# Patient Record
Sex: Female | Born: 1975
Health system: Southern US, Community
[De-identification: ages and names within clinical notes are randomized; demographics above are authoritative.]

## PROBLEM LIST (undated history)

## (undated) DIAGNOSIS — E079 Disorder of thyroid, unspecified: Secondary | ICD-10-CM

## (undated) DIAGNOSIS — E785 Hyperlipidemia, unspecified: Secondary | ICD-10-CM

## (undated) DIAGNOSIS — F419 Anxiety disorder, unspecified: Secondary | ICD-10-CM

## (undated) HISTORY — DX: Anxiety disorder, unspecified: F41.9

## (undated) HISTORY — DX: Disorder of thyroid, unspecified: E07.9

## (undated) HISTORY — DX: Hyperlipidemia, unspecified: E78.5

## (undated) HISTORY — PX: TUBAL LIGATION: SHX77

---

## 2006-11-26 ENCOUNTER — Emergency Department (HOSPITAL_COMMUNITY): Admission: EM | Admit: 2006-11-26 | Discharge: 2006-11-26 | Payer: Self-pay | Admitting: Emergency Medicine

## 2007-06-28 ENCOUNTER — Ambulatory Visit (HOSPITAL_COMMUNITY): Admission: RE | Admit: 2007-06-28 | Discharge: 2007-06-28 | Payer: Self-pay | Admitting: Family Medicine

## 2007-08-02 ENCOUNTER — Ambulatory Visit (HOSPITAL_COMMUNITY): Admission: RE | Admit: 2007-08-02 | Discharge: 2007-08-02 | Payer: Self-pay | Admitting: Family Medicine

## 2008-01-03 ENCOUNTER — Ambulatory Visit: Payer: Self-pay | Admitting: Obstetrics and Gynecology

## 2008-01-05 ENCOUNTER — Inpatient Hospital Stay (HOSPITAL_COMMUNITY): Admission: AD | Admit: 2008-01-05 | Discharge: 2008-01-10 | Payer: Self-pay | Admitting: Obstetrics & Gynecology

## 2008-01-05 ENCOUNTER — Ambulatory Visit: Payer: Self-pay | Admitting: Obstetrics and Gynecology

## 2008-07-22 IMAGING — US US PELVIS COMPLETE MODIFY
1 series · 14 of 25 positions shown · non-contrast
Comparison: none

CLINICAL DATA: 30-year-old female with lower abdominal and pelvic pain. 
TRANSABDOMINAL AND TRANSVAGINAL PELVIC ULTRASOUND:
TECHNIQUE: Both transabdominal and transvaginal ultrasound examinations of the pelvis were performed including evaluation of the uterus, ovaries, adnexal regions, and pelvic cul-de-sac.

[Series 1: unknown · 0.32mm/px · 14 of 64 slices shown]
[im 1/64]
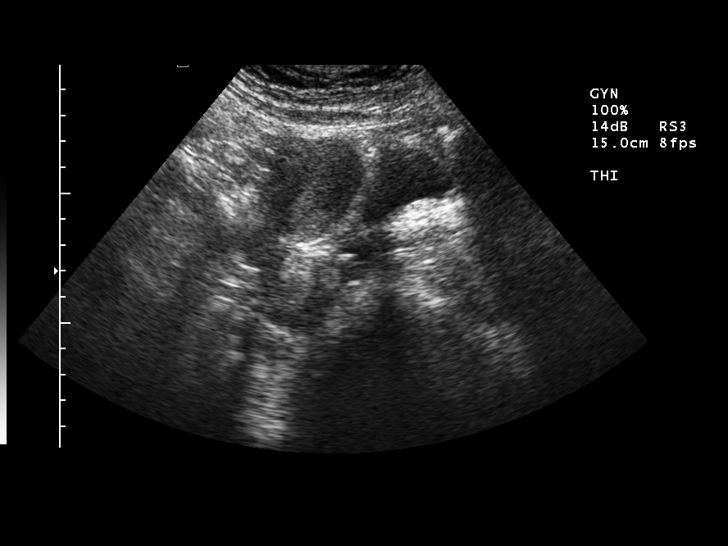
[im 6/64]
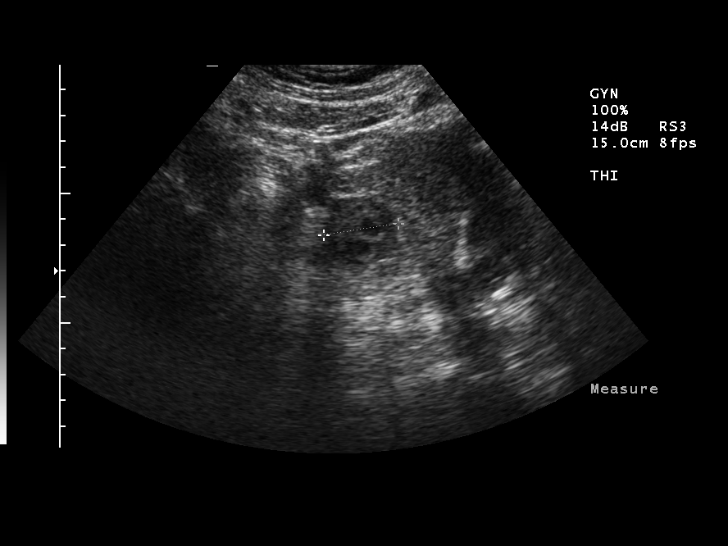
[im 11/64]
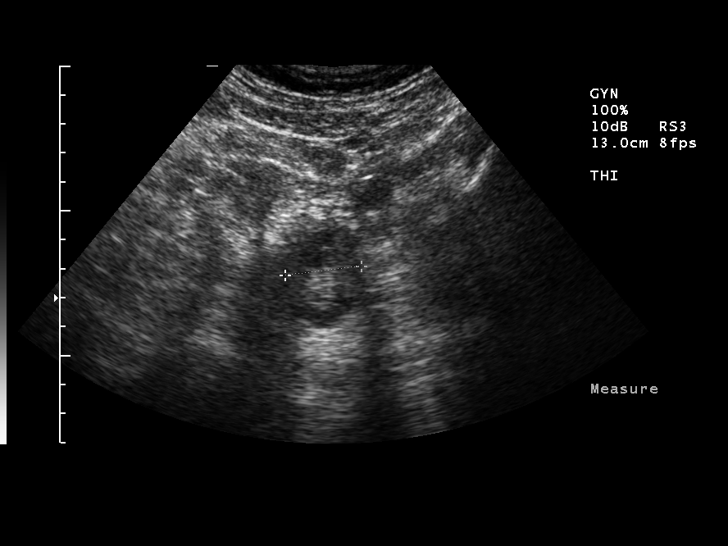
[im 16/64]
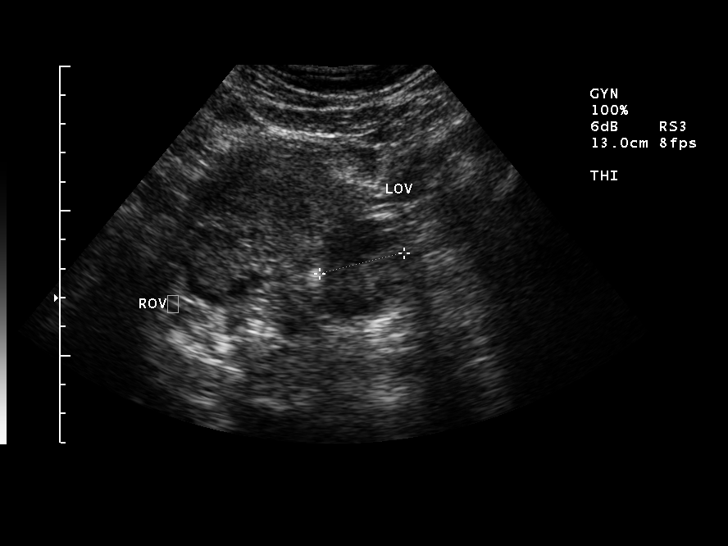
[im 22/64]
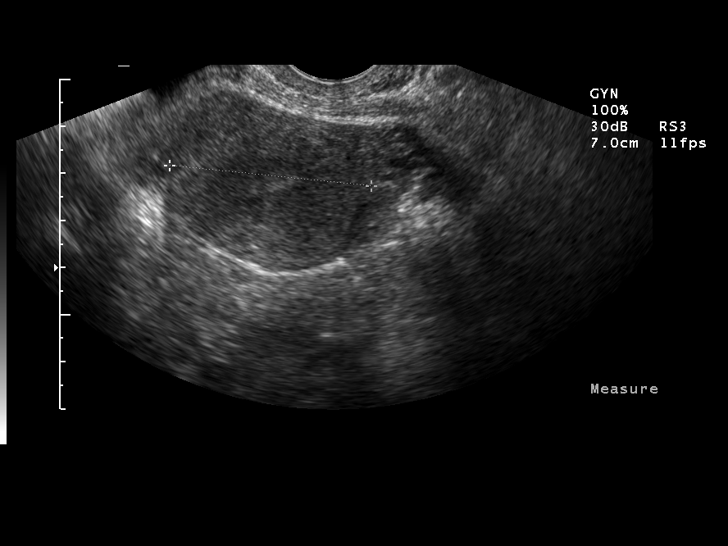
[im 24/64]
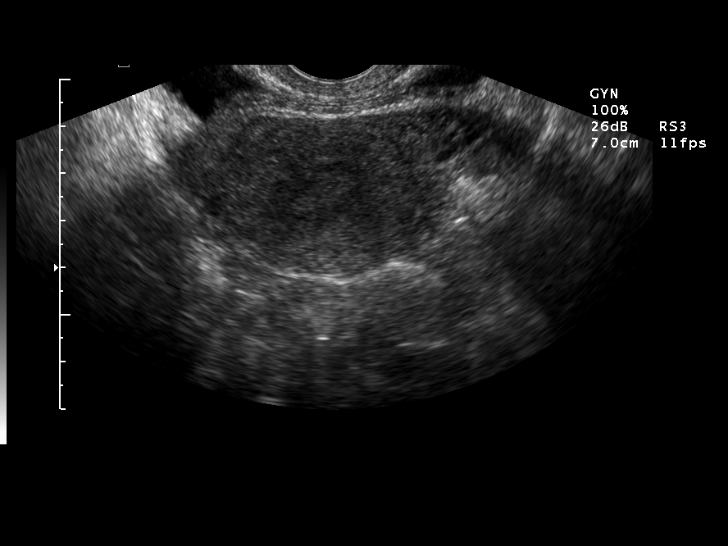
[im 29/64]
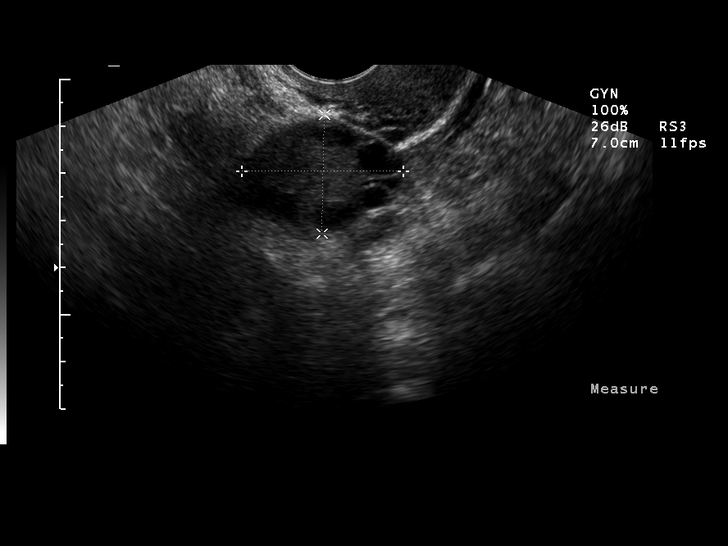
[im 35/64]
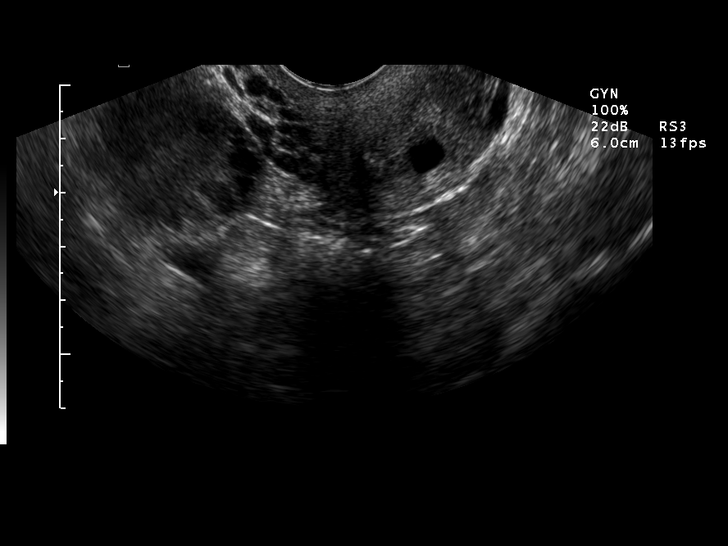
[im 40/64]
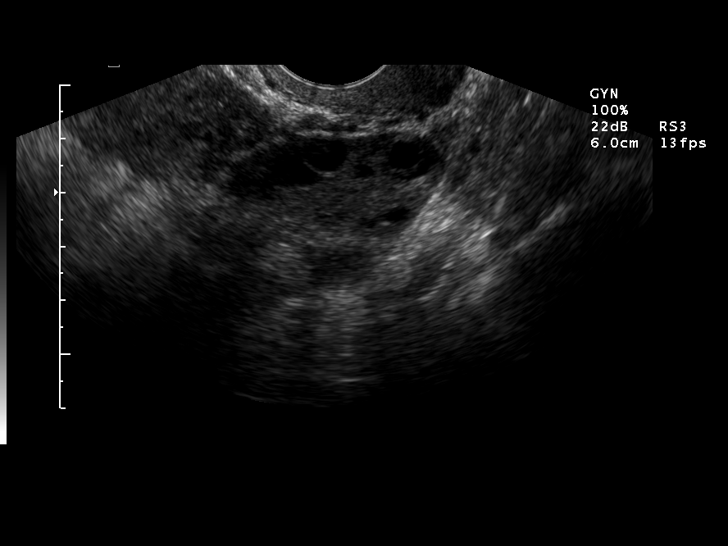
[im 43/64]
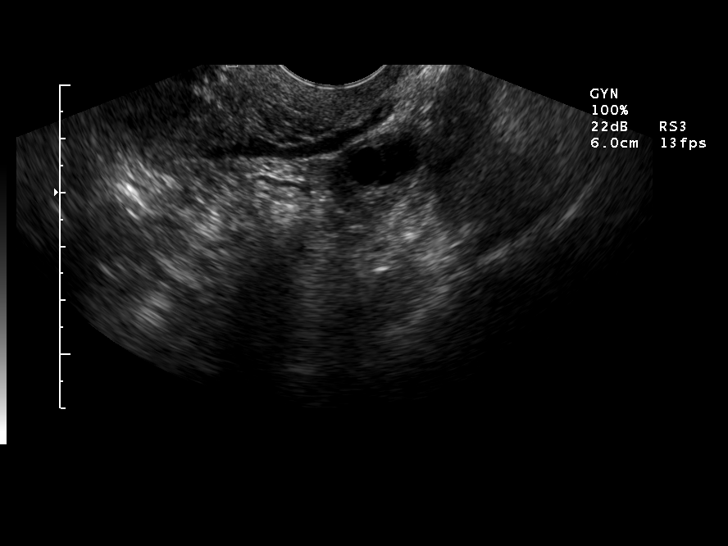
[im 48/64]
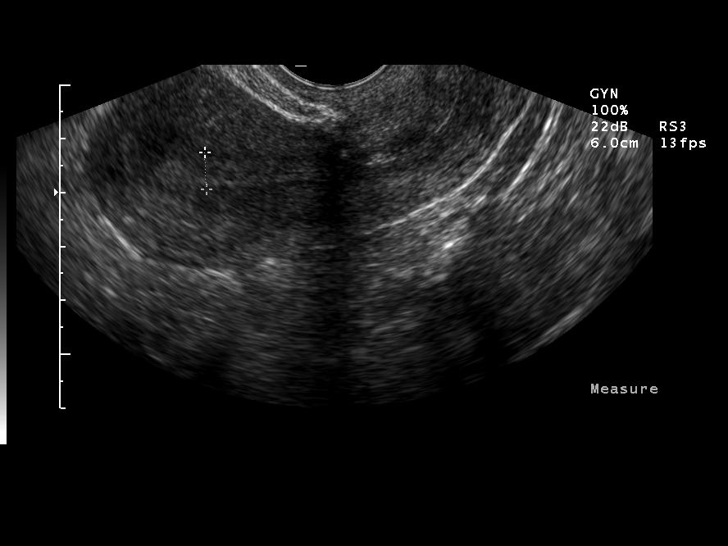
[im 53/64]
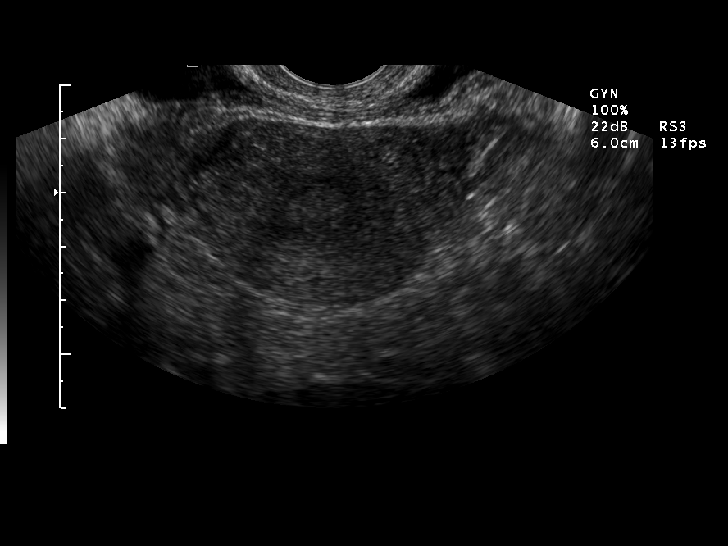
[im 58/64]
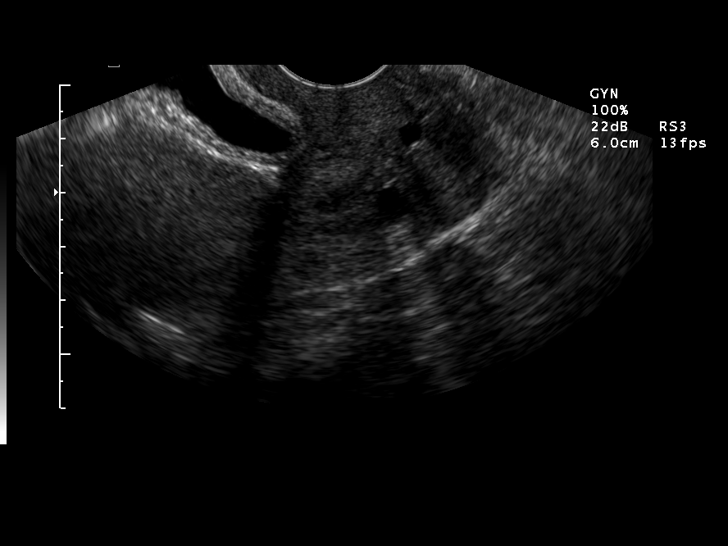
[im 64/64]
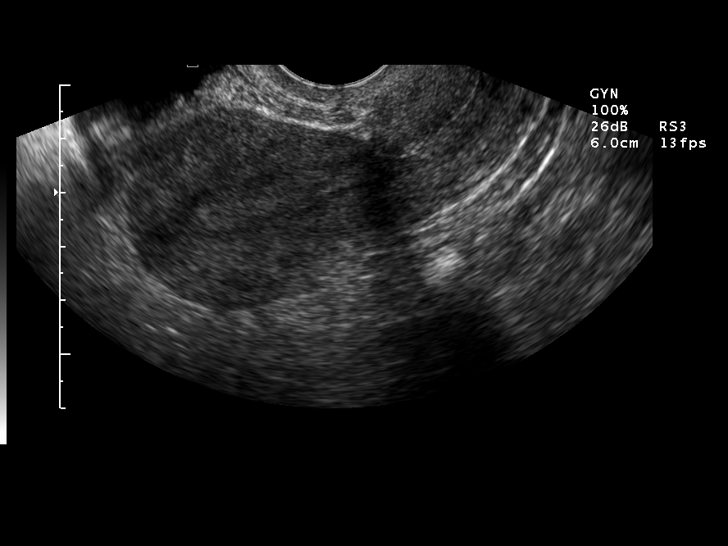

[14 of 25 positions shown; findings below may reference images not displayed]

FINDINGS: Uterus is normal measuring 8.5 x 3.8 x 4.3 cm. Endometrium is normal measuring 7 mm. Small cervical Nabothian cyst is noted. Right ovary measures 3.4 x 2.5 x 2.4 cm. Left ovary measures 3.8 x 2.4 x 2.4 cm. Ovaries demonstrate several follicles bilaterally.  No free fluid.
IMPRESSION: No acute finding by ultrasound.

## 2009-03-25 ENCOUNTER — Ambulatory Visit (HOSPITAL_COMMUNITY): Admission: RE | Admit: 2009-03-25 | Discharge: 2009-03-25 | Payer: Self-pay | Admitting: Family Medicine

## 2009-03-25 ENCOUNTER — Encounter: Payer: Self-pay | Admitting: Family Medicine

## 2009-07-29 ENCOUNTER — Inpatient Hospital Stay (HOSPITAL_COMMUNITY): Admission: RE | Admit: 2009-07-29 | Discharge: 2009-07-31 | Payer: Self-pay | Admitting: Obstetrics & Gynecology

## 2009-07-29 ENCOUNTER — Ambulatory Visit: Payer: Self-pay | Admitting: Obstetrics & Gynecology

## 2010-07-16 LAB — CBC
HCT: 31.4 % — ABNORMAL LOW (ref 36.0–46.0)
HCT: 37.3 % (ref 36.0–46.0)
Hemoglobin: 10.9 g/dL — ABNORMAL LOW (ref 12.0–15.0)
MCHC: 34.7 g/dL (ref 30.0–36.0)
MCHC: 34.8 g/dL (ref 30.0–36.0)
MCV: 93 fL (ref 78.0–100.0)
MCV: 95.1 fL (ref 78.0–100.0)
Platelets: 184 10*3/uL (ref 150–400)
RBC: 3.3 MIL/uL — ABNORMAL LOW (ref 3.87–5.11)
RDW: 15.1 % (ref 11.5–15.5)

## 2010-09-09 NOTE — Op Note (Signed)
NAME:  Stacie Pratt, Stacie Pratt        ACCOUNT NO.:  [id number]   MEDICAL RECORD NO.:  [id number]          PATIENT TYPE:  INP   LOCATION:  9134                          [facility]ILITY:  [facility]   PHYSICIAN:  Tilda Burrow, M.D. DATE OF BIRTH:  [birth year]   DATE OF PROCEDURE:  01/06/2008  DATE OF DISCHARGE:                               OPERATIVE REPORT   PREOPERATIVE DIAGNOSES:  Pregnancy 41 weeks 2 days, prior cesarean  section, desiring vaginal birth after cesarean, induction of labor with  developed nonreassuring fetal status.   POSTOPERATIVE DIAGNOSES:  Pregnancy 41 weeks 2 days, prior cesarean  section, desiring vaginal birth after cesarean, induction of labor with  developed nonreassuring fetal status.  Also, uterine window and old C-  section scar, body cord x1.   PROCEDURE:  Emergency repeat low transverse cervical cesarean section.   SURGEON:  Tilda Burrow, MD   ASSISTANT:  None.   ANESTHESIA:  Epidural.   FINDINGS:  Uterine window and old transverse scar approximately 5 cm in  length, female infant, Apgars 9 and 9, blood gas pH 7.19, body cord x1.  Inferior extension of C-section scar on right.   SPECIMENS:  Placenta to pathology.   ESTIMATED BLOOD LOSS:  500 mL.   INDICATIONS:  A 35 year old Hispanic female undergoing a trial of labor  after cesarean section with previous Foley bulb, cervical ripening, and  Pitocin induction of labor.  Written notes document counseling regarding  trial of labor during earlier portions of admission.  I was called to  see the patient at approximately 2015 whereupon significant prolonged  decel was associated with membrane rupture revealing clear fluid.  Scalp  electrode confirmed baseline in the 100 range with slight improvement  until a contraction developed at which time decel dropped into the 80s.  Decision for cesarean section performed, terbutaline administered.  The  patient taken promptly to C-section room for stat C-section.   DETAILS OF PROCEDURE:  Upon arrival to the OR, epidural was started.  Fetal heart rate confirmed as having improved in response to transfer to  Trendelenburg position and terbutaline and noted in the 140s.  Epidural  quickly topped up and time-out completed.  Lower abdominal vertical  incision repeated with excision of an old thick keloid.  The fascia was  slit in the midline and rectus muscles split in the midline.  The  peritoneal cavity was entered and then the fascial opening taken down to  near the symphysis pubis.  Peritoneal cavity was opened and the bladder  flap was quite high.  The bladder was quite edematous, but no blood  encountered.  Bladder flap was taken down on the lower uterine segment  after retractor pulled the bladder inferiorly.  Once the bladder flap  was taken down, the lower uterine segment was palpated.  The index  finger identified a 5-cm wide window in the midline portion of the very  thinned out old C-section scar.  This opened transversely with minimal  traction transversely and the umbilical cord billowed through the  incision immediately.  Fetal vertex was in the right occiput posterior  position, was rotated, and then an  incision and delivered with fundal  pressure.  The cord was clamped and the infant passed to Dr. Francine Graven,  pediatrician, see her notes for baby's status.  Blood gas pH returned  7.19 with Apgars 9 and 9 assigned.  Placenta was delivered intact with  membranes removed intact.  The uterus was inspected.  No membrane  remnants were identified.  The uterus was inspected and there was an  asymmetric tearing of the uterine incision.  The incision extended into  the uterine vessels on the left side with some arterial bleeding on the  left and small amount, easily controlled with ring forceps and the right  side showed an inferior extension down from the right side of the  incision beneath the bladder.  The inferior margins of the incision   extension could be identified.  The uterus was swabbed out, confirmed as  dry and hemostatic once the incision edges were controlled.  The left  uterine vessel was ligated carefully staying above the bladder and  hemostasis dramatically improved.  Attention was then directed to the  inferior extension on the right side which was sewed from its inferior  most portion upward in a single layer running locking closure.  The  incision itself was then closed with a single layer running locking  closure with a second imbricating closure layer placed on the left one-  half of the incision.  Hemostasis was satisfactory at this time.  One  single mattress suture was required in the midline where the bladder  flap had been taken down to complete hemostasis.   The bladder flap did not require reapproximation.  The bladder was noted  to be edematous.  Urine was checked and was of normal color without  bleeding.  There was no suspicion at anytime that the bladder was  injured in any way.  The peritoneal cavity was inspected and some clots  removed from the left side and the rest of the abdomen appeared clean.  First lap count was correct.  The anterior peritoneum was closed with  running 2-0 chromic on the peritoneum and the fascia closed with running  0 Vicryl.  The subcu fatty tissues were trimmed enough to allow for good  tissue edge approximation and a two-layer closure of interrupted  vertical mattress sutures of 2-0 plain placed in the deep area and a  running horizontal mattress suture placed just beneath the skin and  staple closure of the skin completed the procedure.  Sponge and needle  counts were correct.  EBL 500 mL, less than normal for my C-sections,  and the patient went to recovery room in stable condition.      Tilda Burrow, M.D.  Electronically Signed     JVF/MEDQ  D:  01/06/2008  T:  01/07/2008  Job:  [id number]   cc:   [facility]

## 2010-09-09 NOTE — Discharge Summary (Signed)
Stacie Pratt, Stacie Pratt        ACCOUNT NO.:  [id number]   MEDICAL RECORD NO.:  [id number]          PATIENT TYPE:  INP   LOCATION:  9134                          [facility]ILITY:  [facility]   PHYSICIAN:  Tilda Burrow, M.D. DATE OF BIRTH:  [birth year]   DATE OF ADMISSION:  01/05/2008  DATE OF DISCHARGE:  01/09/2008                               DISCHARGE SUMMARY   ADMITTING DIAGNOSES:  Pregnancy at 61 weeks' gestation, prior cesarean  section desiring trial of labor after cesarean.   DISCHARGE DIAGNOSES:  Pregnancy at 41+ weeks' gestation, delivered  failed trial of labor after cesarean, the uterine window and old C-  section scar and nonreassuring fetal status indicating need for cesarean  delivery.   FINDINGS:  Female infant, Apgars 9 and 9, pH 7.19 and body cord x1.   HOSPITAL SUMMARY:  This 35 year old female gravida 2, para 1 at 41+  weeks' gestation was admitted for induction of labor after the patient  requesting a trial of labor after cesarean.  Prior cesarean section was  in [place], a vertical abdominal incision was used.  The patient reports  signing of trial of labor consent forms as an outpatient basis.  She was  sent to the hospital after her nonstress test at 41 weeks and 1 day  showed a fetal heart rate deceleration in the 80s for 2 minutes with  spontaneous complete recovery.  She was admitted at that time with Foley  bulb cervical ripening attempted.   HOSPITAL COURSE:  The patient was admitted, a Foley bulb ripening  attempted on the afternoon of January 05, 2008.  The following day,  January 06, 2008, she was begun on Pitocin.  She progressed only  slowly through the day.  She was begun on 2 milliunits at 2 a.m.,  progressed through the day with minimal progress, probably was in place  until midday.  At 6 p.m., cervix was 3 cm long, -3 with vertex not  really entered the cervix, spontaneous rupture of membranes occurred  approximately 8:15 p.m. and when the cervix  was checked it was 6-7 cm,  90%, -3 with vertex remaining at pelvic end, ischial spines were  considered extremely prominent.  Due to the nonreassuring fetal status  and only gradual improvement with the minimal prognosis for vaginal  delivery given the type.  However, she was taken to the OR for a C-  section in an emergency fashion.  Findings at the time of surgery  include a female infant, Apgars 9 and 9, pH of 7.19, and there was  a  body cord x1.  She had a minimal blood loss of 500 mL.  The surgery was  notable and that once the edematous bladder was taken down on the low  uterine segment.  There was a window in the low uterine segment scar  that did not tear into the adjacent tissues.  C-section was otherwise  uneventful.  Postoperatively, the patient remained stable, had slow  ambulation for the first 24 hours, but then did well.  The baby  breastfed.  She had a postpartum hemoglobin 10, hematocrit 31.6 compared  to hemoglobin 12.9 and  hematocrit 38 at the time of admission.  She  remained afebrile throughout the postpartum course.  The incision was in  good shape with no erythema.  Staples were left in place and she will  have visiting home health nurse see her in 5-7 days for staple removal.      Tilda Burrow, M.D.  Electronically Signed    JVF/MEDQ  D:  01/09/2008  T:  01/10/2008  Job:  [id number]

## 2010-10-31 ENCOUNTER — Other Ambulatory Visit (HOSPITAL_COMMUNITY)
Admission: RE | Admit: 2010-10-31 | Discharge: 2010-10-31 | Disposition: A | Discharge status: Home / Self Care | Payer: Self-pay | Source: Ambulatory Visit | Attending: Family Medicine | Admitting: Family Medicine

## 2010-10-31 ENCOUNTER — Other Ambulatory Visit: Payer: Self-pay | Admitting: Family Medicine

## 2010-10-31 DIAGNOSIS — Z113 Encounter for screening for infections with a predominantly sexual mode of transmission: Secondary | ICD-10-CM | POA: Insufficient documentation

## 2010-10-31 DIAGNOSIS — Z124 Encounter for screening for malignant neoplasm of cervix: Secondary | ICD-10-CM | POA: Insufficient documentation

## 2010-11-05 ENCOUNTER — Other Ambulatory Visit: Payer: Self-pay | Admitting: Family Medicine

## 2011-01-28 LAB — CBC
HCT: 38
Hemoglobin: 10.8 — ABNORMAL LOW
MCHC: 34.1
MCV: 93.4
Platelets: 176
Platelets: 185
RBC: 4.08
RDW: 14.7
RDW: 14.9
RDW: 15

## 2011-01-28 LAB — RPR: RPR Ser Ql: NONREACTIVE

## 2011-02-09 LAB — DIFFERENTIAL
Basophils Relative: 0
Eosinophils Absolute: 0.1
Monocytes Absolute: 0.3
Monocytes Relative: 5

## 2011-02-09 LAB — WET PREP, GENITAL
Clue Cells Wet Prep HPF POC: NONE SEEN
Trich, Wet Prep: NONE SEEN
WBC, Wet Prep HPF POC: NONE SEEN
Yeast Wet Prep HPF POC: NONE SEEN

## 2011-02-09 LAB — COMPREHENSIVE METABOLIC PANEL
ALT: 22
Albumin: 4.3
Alkaline Phosphatase: 37 — ABNORMAL LOW
GFR calc Af Amer: >60
Potassium: 3.8
Sodium: 138
Total Protein: 7.7

## 2011-02-09 LAB — URINALYSIS, ROUTINE W REFLEX MICROSCOPIC
Glucose, UA: NEGATIVE
Protein, ur: NEGATIVE
Urobilinogen, UA: 0.2

## 2011-02-09 LAB — URINE MICROSCOPIC-ADD ON

## 2011-02-09 LAB — GC/CHLAMYDIA PROBE AMP, GENITAL
Chlamydia, DNA Probe: NEGATIVE
GC Probe Amp, Genital: NEGATIVE

## 2011-02-09 LAB — CBC
Hemoglobin: 12.9
Platelets: 207
RDW: 13.3

## 2011-02-09 LAB — HCG, QUANTITATIVE, PREGNANCY: hCG, Beta Chain, Quant, S: <2

## 2011-12-09 ENCOUNTER — Other Ambulatory Visit (HOSPITAL_COMMUNITY)
Admission: RE | Admit: 2011-12-09 | Discharge: 2011-12-09 | Disposition: A | Discharge status: Home / Self Care | Payer: Self-pay | Source: Ambulatory Visit | Attending: Family Medicine | Admitting: Family Medicine

## 2011-12-09 ENCOUNTER — Other Ambulatory Visit: Payer: Self-pay | Admitting: Family Medicine

## 2011-12-09 DIAGNOSIS — Z113 Encounter for screening for infections with a predominantly sexual mode of transmission: Secondary | ICD-10-CM | POA: Insufficient documentation

## 2011-12-09 DIAGNOSIS — Z124 Encounter for screening for malignant neoplasm of cervix: Secondary | ICD-10-CM | POA: Insufficient documentation

## 2011-12-15 ENCOUNTER — Other Ambulatory Visit (HOSPITAL_COMMUNITY): Payer: Self-pay | Admitting: Family Medicine

## 2011-12-15 DIAGNOSIS — N949 Unspecified condition associated with female genital organs and menstrual cycle: Secondary | ICD-10-CM

## 2011-12-23 ENCOUNTER — Other Ambulatory Visit (HOSPITAL_COMMUNITY): Payer: Self-pay

## 2011-12-23 ENCOUNTER — Inpatient Hospital Stay (HOSPITAL_COMMUNITY): Admission: RE | Admit: 2011-12-23 | Payer: Self-pay | Source: Ambulatory Visit

## 2016-05-12 ENCOUNTER — Other Ambulatory Visit: Payer: Self-pay | Admitting: Primary Care

## 2016-05-12 DIAGNOSIS — Z1231 Encounter for screening mammogram for malignant neoplasm of breast: Secondary | ICD-10-CM

## 2016-05-21 ENCOUNTER — Ambulatory Visit
Admission: RE | Admit: 2016-05-21 | Discharge: 2016-05-21 | Disposition: A | Discharge status: Home / Self Care | Payer: No Typology Code available for payment source | Source: Ambulatory Visit | Attending: Primary Care | Admitting: Primary Care

## 2016-05-21 DIAGNOSIS — Z1231 Encounter for screening mammogram for malignant neoplasm of breast: Secondary | ICD-10-CM

## 2017-07-20 ENCOUNTER — Other Ambulatory Visit: Payer: Self-pay | Admitting: Primary Care

## 2017-07-20 DIAGNOSIS — Z1231 Encounter for screening mammogram for malignant neoplasm of breast: Secondary | ICD-10-CM

## 2017-08-20 ENCOUNTER — Ambulatory Visit
Admission: RE | Admit: 2017-08-20 | Discharge: 2017-08-20 | Disposition: A | Discharge status: Home / Self Care | Payer: No Typology Code available for payment source | Source: Ambulatory Visit | Attending: Primary Care | Admitting: Primary Care

## 2017-08-20 DIAGNOSIS — Z1231 Encounter for screening mammogram for malignant neoplasm of breast: Secondary | ICD-10-CM

## 2018-10-06 ENCOUNTER — Encounter (HOSPITAL_COMMUNITY): Payer: Self-pay | Admitting: Emergency Medicine

## 2018-10-06 ENCOUNTER — Other Ambulatory Visit: Payer: Self-pay

## 2018-10-06 ENCOUNTER — Ambulatory Visit (HOSPITAL_COMMUNITY)
Admission: EM | Admit: 2018-10-06 | Discharge: 2018-10-06 | Disposition: A | Discharge status: Home / Self Care | Payer: Self-pay | Attending: Family Medicine | Admitting: Family Medicine

## 2018-10-06 DIAGNOSIS — B351 Tinea unguium: Secondary | ICD-10-CM

## 2018-10-06 DIAGNOSIS — F411 Generalized anxiety disorder: Secondary | ICD-10-CM

## 2018-10-06 MED ORDER — HYDROXYZINE HCL 25 MG PO TABS: 25.0000 mg | ORAL_TABLET | Freq: Four times a day (QID) | ORAL | 0 refills | Status: DC

## 2018-10-06 MED ORDER — TERBINAFINE HCL 250 MG PO TABS: 250.0000 mg | ORAL_TABLET | Freq: Every day | ORAL | 0 refills | Status: DC

## 2018-10-06 NOTE — ED Triage Notes (Addendum)
Right thumb nail is dark and nail is built up.  Patient thinks water may have seeped into thumb nail while doing dishes.  Patient has had issues with this thumb nail for a month, pcp was sending her to a specialist, but this has been halted by covid  Requesting hard copy script

## 2018-10-11 NOTE — ED Provider Notes (Signed)
[place]   [id number] 10/06/18 Arrival Time: 1916  ASSESSMENT & PLAN:  1. Onychomycosis   2. Generalized anxiety disorder     Meds ordered this encounter  Medications  . terbinafine (LAMISIL) 250 MG tablet    Sig: Take 1 tablet (250 mg total) by mouth daily.    Dispense:  42 tablet    Refill:  0  . hydrOXYzine (ATARAX/VISTARIL) 25 MG tablet    Sig: Take 1 tablet (25 mg total) by mouth every 6 (six) hours.    Dispense:  12 tablet    Refill:  0   Will follow up with PCP or here if worsening or failing to improve as anticipated. Recommend PCP evaluation for anxiety.  Reviewed expectations re: course of current medical issues. Questions answered. Outlined signs and symptoms indicating need for more acute intervention. Patient verbalized understanding. After Visit Summary given.   SUBJECTIVE:  Wanza Szumski is a 43 y.o. female who reports many months of R thumbnail thickening and discoloration. Onset: gradual No associated pain or skin irritation. Progression: stable  Drainage? No  Known trigger? No  Contacts with similar? No Recent travel? No  Other associated symptoms: none Therapies tried thus far: none Arthralgia or myalgia? None Afebrile. Normal thumb ROM reported. No specific aggravating or alleviating factors reported. Also reports on/off anxiety; "feeling nervous"; no SI/HI. Occasionally affects sleep. PCP was supposed to send her to specialist for thumbnail but COVID has delayed.  ROS: As per HPI.  OBJECTIVE: Vitals:   10/06/18 1944  BP: 101/64  Pulse: 72  Resp: 18  Temp: 98.3 F (36.8 C)  TempSrc: Oral  SpO2: 100%    General appearance: alert; no distress Lungs: clear to auscultation bilaterally Heart: regular rate and rhythm Extremities: no edema Skin: warm and dry; onychomycosis of R thumbnail Psychological: alert and cooperative; normal mood and affect  No Known Allergies  History reviewed. No pertinent past medical  history. Social History   Socioeconomic History  . Marital status: Single    Spouse name: Not on file  . Number of children: Not on file  . Years of education: Not on file  . Highest education level: Not on file  Occupational History  . Not on file  Social Needs  . Financial resource strain: Not on file  . Food insecurity    Worry: Not on file    Inability: Not on file  . Transportation needs    Medical: Not on file    Non-medical: Not on file  Tobacco Use  . Smoking status: Never Smoker  Substance and Sexual Activity  . Alcohol use: Never    Frequency: Never  . Drug use: Never  . Sexual activity: Not on file  Lifestyle  . Physical activity    Days per week: Not on file    Minutes per session: Not on file  . Stress: Not on file  Relationships  . Social Herbalist on phone: Not on file    Gets together: Not on file    Attends religious service: Not on file    Active member of club or organization: Not on file    Attends meetings of clubs or organizations: Not on file    Relationship status: Not on file  . Intimate partner violence    Fear of current or ex partner: Not on file    Emotionally abused: Not on file    Physically abused: Not on file    Forced sexual activity: Not  on file  Other Topics Concern  . Not on file  Social History Narrative  . Not on file   Family History  Problem Relation Age of Onset  . Healthy Mother   . Healthy Father    History reviewed. No pertinent surgical history.   Mardella LaymanHagler, Ellamae Lybeck, MD 10/11/18 1124

## 2019-08-23 ENCOUNTER — Other Ambulatory Visit: Payer: Self-pay | Admitting: Nurse Practitioner

## 2019-08-23 ENCOUNTER — Encounter: Payer: Self-pay | Admitting: Nurse Practitioner

## 2019-08-23 ENCOUNTER — Ambulatory Visit: Payer: Self-pay | Attending: Nurse Practitioner | Admitting: Nurse Practitioner

## 2019-08-23 ENCOUNTER — Other Ambulatory Visit: Payer: Self-pay

## 2019-08-23 VITALS — Ht 59.84 in

## 2019-08-23 DIAGNOSIS — Z13220 Encounter for screening for lipoid disorders: Secondary | ICD-10-CM

## 2019-08-23 DIAGNOSIS — Z13228 Encounter for screening for other metabolic disorders: Secondary | ICD-10-CM

## 2019-08-23 DIAGNOSIS — B351 Tinea unguium: Secondary | ICD-10-CM

## 2019-08-23 DIAGNOSIS — Z130 Encounter for screening for diseases of the blood and blood-forming organs and certain disorders involving the immune mechanism: Secondary | ICD-10-CM

## 2019-08-23 MED ORDER — CICLOPIROX 8 % EX SOLN: Freq: Every day | CUTANEOUS | 3 refills | Status: DC

## 2019-08-23 MED FILL — CICLOPIROX 8% SOLUTION: 8 | 30 days supply | Qty: 7 | Fill #0

## 2019-08-23 NOTE — Progress Notes (Signed)
Virtual Visit via Telephone Note Due to national recommendations of social distancing due to [place] 19, telehealth visit is felt to be most appropriate for this patient at this time.  I discussed the limitations, risks, security and privacy concerns of performing an evaluation and management service by telephone and the availability of in person appointments. I also discussed with the patient that there may be a patient responsible charge related to this service. The patient expressed understanding and agreed to proceed.    I connected with Stacie Pratt on 08/23/19  at   9:50 AM EDT  EDT by telephone and verified that I am speaking with the correct person using two identifiers.   Consent I discussed the limitations, risks, security and privacy concerns of performing an evaluation and management service by telephone and the availability of in person appointments. I also discussed with the patient that there may be a patient responsible charge related to this service. The patient expressed understanding and agreed to proceed.   Location of Patient: Private Residence   Location of Provider: [place] participating in Telemedicine visit: Geryl Rankins FNP-BC [place] Interpreter ID# Dedra Skeens [id number]   History of Present Illness: Telemedicine visit for: Establish Care She has no previous past medical history.   REFERRED TO [facility] for mammogram today  Skin Problem She has concerns of nail fungus of the right thumbnail. She was prescribed lamisil in the Emergency Room last year which she states did not provide resolution of her skin problem. She took the lamisil for 1.5 months. Will prescribe penlac. May need to see dermatology if ineffective. Patient has been advised to apply for financial assistance and schedule to see our financial counselor.     History reviewed. No pertinent past medical history.  Past  Surgical History:  Procedure Laterality Date  . CESAREAN SECTION     3x  . TUBAL LIGATION      Family History  Problem Relation Age of Onset  . Healthy Mother   . Healthy Father   . Diabetes Sister     Social History   Socioeconomic History  . Marital status: Single    Spouse name: Not on file  . Number of children: Not on file  . Years of education: Not on file  . Highest education level: Not on file  Occupational History  . Not on file  Tobacco Use  . Smoking status: Never Smoker  . Smokeless tobacco: Never Used  Substance and Sexual Activity  . Alcohol use: Never  . Drug use: Never  . Sexual activity: Yes    Birth control/protection: Surgical  Other Topics Concern  . Not on file  Social History Narrative  . Not on file   Social Determinants of Health   Financial Resource Strain:   . Difficulty of Paying Living Expenses:   Food Insecurity:   . Worried About Charity fundraiser in the Last Year:   . Arboriculturist in the Last Year:   Transportation Needs:   . Film/video editor (Medical):   Marland Kitchen Lack of Transportation (Non-Medical):   Physical Activity:   . Days of Exercise per Week:   . Minutes of Exercise per Session:   Stress:   . Feeling of Stress :   Social Connections:   . Frequency of Communication with Friends and Family:   . Frequency of Social Gatherings with Friends and Family:   . Attends  Religious Services:   . Active Member of Clubs or Organizations:   . Attends Archivist Meetings:   Marland Kitchen Marital Status:      Observations/Objective: Awake, alert and oriented x 3   Review of Systems  Constitutional: Negative for fever, malaise/fatigue and weight loss.  HENT: Negative.  Negative for nosebleeds.   Eyes: Negative.  Negative for blurred vision, double vision and photophobia.  Respiratory: Negative.  Negative for cough and shortness of breath.   Cardiovascular: Negative.  Negative for chest pain, palpitations and leg swelling.   Gastrointestinal: Negative.  Negative for heartburn, nausea and vomiting.  Musculoskeletal: Negative.  Negative for myalgias.  Skin: Positive for rash.  Neurological: Negative.  Negative for dizziness, focal weakness, seizures and headaches.  Psychiatric/Behavioral: Negative.  Negative for suicidal ideas.    Assessment and Plan: Stacie Pratt was seen today for establish care.  Diagnoses and all orders for this visit:  Onychomycosis of nail of digit of hand -     Ambulatory referral to Dermatology -     Hemoglobin A1c; Future  Screening for metabolic disorder -     [id number]; Future  Lipid screening -     Lipid panel; Future  Screening for deficiency anemia -     CBC; Future  Other orders -     ciclopirox (PENLAC) 8 % solution; Apply topically at bedtime. Apply over nail and surrounding skin. Apply daily over previous coat. After seven (7) days, may remove with alcohol and continue cycle.     Follow Up Instructions Return for PAP SMEAR.     I discussed the assessment and treatment plan with the patient. The patient was provided an opportunity to ask questions and all were answered. The patient agreed with the plan and demonstrated an understanding of the instructions.   The patient was advised to call back or seek an in-person evaluation if the symptoms worsen or if the condition fails to improve as anticipated.  I provided 15 minutes of non-face-to-face time during this encounter including median intraservice time, reviewing previous notes, labs, imaging, medications and explaining diagnosis and management.  Gildardo Pounds, FNP-BC

## 2019-08-29 ENCOUNTER — Other Ambulatory Visit: Payer: Self-pay

## 2019-08-29 ENCOUNTER — Ambulatory Visit: Payer: Self-pay | Attending: Primary Care

## 2019-08-29 DIAGNOSIS — Z13228 Encounter for screening for other metabolic disorders: Secondary | ICD-10-CM

## 2019-08-29 DIAGNOSIS — Z130 Encounter for screening for diseases of the blood and blood-forming organs and certain disorders involving the immune mechanism: Secondary | ICD-10-CM

## 2019-08-29 DIAGNOSIS — Z13220 Encounter for screening for lipoid disorders: Secondary | ICD-10-CM

## 2019-08-29 DIAGNOSIS — B351 Tinea unguium: Secondary | ICD-10-CM

## 2019-08-30 LAB — LIPID PANEL
Chol/HDL Ratio: 4.5 ratio — ABNORMAL HIGH (ref 0.0–4.4)
Cholesterol, Total: 233 mg/dL — ABNORMAL HIGH (ref 100–199)
HDL: 52 mg/dL (ref >39)
LDL Chol Calc (NIH): 158 mg/dL — ABNORMAL HIGH (ref 0–99)
Triglycerides: 130 mg/dL (ref 0–149)
VLDL Cholesterol Cal: 23 mg/dL (ref 5–40)

## 2019-08-30 LAB — CBC
Hematocrit: 40.1 % (ref 34.0–46.6)
Hemoglobin: 13.6 g/dL (ref 11.1–15.9)
MCH: 30.8 pg (ref 26.6–33.0)
MCHC: 33.9 g/dL (ref 31.5–35.7)
MCV: 91 fL (ref 79–97)
Platelets: 237 10*3/uL (ref 150–450)
RBC: 4.41 x10E6/uL (ref 3.77–5.28)
RDW: 13.2 % (ref 11.7–15.4)
WBC: 4.5 10*3/uL (ref 3.4–10.8)

## 2019-08-30 LAB — CMP14+EGFR
ALT: 15 IU/L (ref 0–32)
AST: 23 IU/L (ref 0–40)
Albumin/Globulin Ratio: 2 (ref 1.2–2.2)
Albumin: 4.5 g/dL (ref 3.8–4.8)
Alkaline Phosphatase: 37 IU/L — ABNORMAL LOW (ref 39–117)
BUN/Creatinine Ratio: 15 (ref 9–23)
BUN: 11 mg/dL (ref 6–24)
Bilirubin Total: 0.3 mg/dL (ref 0.0–1.2)
CO2: 22 mmol/L (ref 20–29)
Calcium: 9.5 mg/dL (ref 8.7–10.2)
Chloride: 101 mmol/L (ref 96–106)
Creatinine, Ser: 0.72 mg/dL (ref 0.57–1.00)
GFR calc Af Amer: 117 mL/min/{1.73_m2} (ref >59)
GFR calc non Af Amer: 101 mL/min/{1.73_m2} (ref >59)
Globulin, Total: 2.2 g/dL (ref 1.5–4.5)
Glucose: 84 mg/dL (ref 65–99)
Potassium: 4.5 mmol/L (ref 3.5–5.2)
Sodium: 138 mmol/L (ref 134–144)
Total Protein: 6.7 g/dL (ref 6.0–8.5)

## 2019-08-30 LAB — HEMOGLOBIN A1C
Est. average glucose Bld gHb Est-mCnc: 114 mg/dL
Hgb A1c MFr Bld: 5.6 % (ref 4.8–5.6)

## 2019-09-06 ENCOUNTER — Ambulatory Visit: Payer: Self-pay

## 2019-09-08 ENCOUNTER — Other Ambulatory Visit: Payer: Self-pay

## 2019-09-08 DIAGNOSIS — Z1231 Encounter for screening mammogram for malignant neoplasm of breast: Secondary | ICD-10-CM

## 2019-09-11 ENCOUNTER — Other Ambulatory Visit: Payer: Self-pay | Admitting: Obstetrics and Gynecology

## 2019-09-11 DIAGNOSIS — Z1231 Encounter for screening mammogram for malignant neoplasm of breast: Secondary | ICD-10-CM

## 2019-09-26 ENCOUNTER — Ambulatory Visit: Payer: Self-pay

## 2019-09-27 ENCOUNTER — Other Ambulatory Visit: Payer: Self-pay

## 2019-09-27 ENCOUNTER — Ambulatory Visit: Payer: Self-pay | Attending: Nurse Practitioner

## 2019-09-27 ENCOUNTER — Ambulatory Visit: Payer: Self-pay

## 2019-10-04 ENCOUNTER — Ambulatory Visit: Payer: Self-pay | Attending: Nurse Practitioner | Admitting: Nurse Practitioner

## 2019-10-04 ENCOUNTER — Telehealth: Payer: Self-pay | Admitting: Nurse Practitioner

## 2019-10-04 ENCOUNTER — Encounter: Payer: Self-pay | Admitting: Nurse Practitioner

## 2019-10-04 ENCOUNTER — Other Ambulatory Visit: Payer: Self-pay

## 2019-10-04 VITALS — BP 122/72 | HR 68 | Temp 97.9°F | Ht 60.5 in | Wt 140.0 lb

## 2019-10-04 DIAGNOSIS — Z124 Encounter for screening for malignant neoplasm of cervix: Secondary | ICD-10-CM

## 2019-10-04 DIAGNOSIS — Z1159 Encounter for screening for other viral diseases: Secondary | ICD-10-CM

## 2019-10-04 DIAGNOSIS — Z114 Encounter for screening for human immunodeficiency virus [HIV]: Secondary | ICD-10-CM

## 2019-10-04 NOTE — Telephone Encounter (Signed)
Patient has questions that she needs to discuss about financial counseling.

## 2019-10-04 NOTE — Progress Notes (Signed)
Assessment & Plan:  Stacie Pratt was seen today for gynecologic exam.  Diagnoses and all orders for this visit:  Encounter for Papanicolaou smear for cervical cancer screening -     Cytology - PAP -     Cervicovaginal ancillary only  Need for hepatitis C screening test -     Hepatitis C Antibody  Encounter for screening for HIV -     HIV antibody (with reflex)    Patient has been counseled on age-appropriate routine health concerns for screening and prevention. These are reviewed and up-to-date. Referrals have been placed accordingly. Immunizations are up-to-date or declined.    Subjective:   Chief Complaint  Patient presents with  . Gynecologic Exam    Pt. is here for a pap smear.    HPI Stacie Pratt 44 y.o. female presents to office today for PAP smear.   Review of Systems  Constitutional: Negative.  Negative for chills, fever, malaise/fatigue and weight loss.  Respiratory: Negative.  Negative for cough, shortness of breath and wheezing.   Cardiovascular: Negative.  Negative for chest pain, orthopnea and leg swelling.  Gastrointestinal: Negative for abdominal pain.  Genitourinary: Negative.  Negative for flank pain.  Skin: Negative.  Negative for rash.  Psychiatric/Behavioral: Negative for suicidal ideas.    History reviewed. No pertinent past medical history.  Past Surgical History:  Procedure Laterality Date  . CESAREAN SECTION     3x  . TUBAL LIGATION      Family History  Problem Relation Age of Onset  . Healthy Mother   . Healthy Father   . Diabetes Sister     Social History Reviewed with no changes to be made today.   Outpatient Medications Prior to Visit  Medication Sig Dispense Refill  . ciclopirox (PENLAC) 8 % solution Apply topically at bedtime. Apply over nail and surrounding skin. Apply daily over previous coat. After seven (7) days, may remove with alcohol and continue cycle. (Patient not taking: Reported on 10/04/2019) 6.6 mL 3  .  hydrOXYzine (ATARAX/VISTARIL) 25 MG tablet Take 1 tablet (25 mg total) by mouth every 6 (six) hours. (Patient not taking: Reported on 08/23/2019) 12 tablet 0  . terbinafine (LAMISIL) 250 MG tablet Take 1 tablet (250 mg total) by mouth daily. (Patient not taking: Reported on 08/23/2019) 42 tablet 0   No facility-administered medications prior to visit.    No Known Allergies     Objective:    BP 122/72 (BP Location: Left Arm, Patient Position: Sitting, Cuff Size: Normal)   Pulse 68   Temp 97.9 F (36.6 C) (Temporal)   Ht 5' 0.5" (1.537 m)   Wt 140 lb (63.5 kg)   LMP 09/19/2019   SpO2 100%   BMI 26.89 kg/m  Wt Readings from Last 3 Encounters:  10/04/19 140 lb (63.5 kg)    Physical Exam Exam conducted with a chaperone present.  Constitutional:      Appearance: She is well-developed.  HENT:     Head: Normocephalic.  Cardiovascular:     Rate and Rhythm: Normal rate and regular rhythm.     Heart sounds: Normal heart sounds.  Pulmonary:     Effort: Pulmonary effort is normal.     Breath sounds: Normal breath sounds.  Abdominal:     General: Bowel sounds are normal.     Palpations: Abdomen is soft.     Hernia: There is no hernia in the left inguinal area.  Genitourinary:    Exam position: Lithotomy position.  Labia:        Right: No rash, tenderness, lesion or injury.        Left: No rash, tenderness, lesion or injury.      Vagina: Normal. No signs of injury and foreign body. No vaginal discharge, erythema, tenderness or bleeding.     Cervix: No cervical motion tenderness or friability.     Uterus: Not deviated and not enlarged.      Adnexa:        Right: No mass, tenderness or fullness.         Left: No mass, tenderness or fullness.       Rectum: Normal. No external hemorrhoid.  Lymphadenopathy:     Lower Body: No right inguinal adenopathy. No left inguinal adenopathy.  Skin:    General: Skin is warm and dry.  Neurological:     Mental Status: She is alert and  oriented to person, place, and time.  Psychiatric:        Behavior: Behavior normal.        Thought Content: Thought content normal.        Judgment: Judgment normal.          Patient has been counseled extensively about nutrition and exercise as well as the importance of adherence with medications and regular follow-up. The patient was given clear instructions to go to ER or return to medical center if symptoms don't improve, worsen or new problems develop. The patient verbalized understanding.   Follow-up: Return if symptoms worsen or fail to improve.   Claiborne Rigg, FNP-BC [facility] [place], [place] [phone]   10/04/2019, 3:16 PM

## 2019-10-05 LAB — CERVICOVAGINAL ANCILLARY ONLY
Bacterial Vaginitis (gardnerella): NEGATIVE
Candida Glabrata: NEGATIVE
Candida Vaginitis: NEGATIVE
Chlamydia: NEGATIVE
Comment: NEGATIVE
Comment: NEGATIVE
Comment: NEGATIVE
Comment: NEGATIVE
Comment: NEGATIVE
Comment: NORMAL
Neisseria Gonorrhea: NEGATIVE
Trichomonas: NEGATIVE

## 2019-10-05 LAB — CYTOLOGY - PAP
Comment: NEGATIVE
Diagnosis: NEGATIVE
High risk HPV: NEGATIVE

## 2019-10-05 LAB — HEPATITIS C ANTIBODY: Hep C Virus Ab: <0.1 s/co ratio (ref 0.0–0.9)

## 2019-10-05 LAB — HIV ANTIBODY (ROUTINE TESTING W REFLEX): HIV Screen 4th Generation wRfx: NONREACTIVE

## 2019-10-06 ENCOUNTER — Telehealth: Payer: Self-pay

## 2019-10-06 NOTE — Telephone Encounter (Signed)
-----   Message from Claiborne Rigg, NP sent at 10/06/2019  8:49 AM EDT ----- Pap smear is normal and vaginal smear is normal as well.  HIV and hepatitis are both negative.

## 2019-10-06 NOTE — Telephone Encounter (Signed)
Patient name and DOB has been verified Patient was informed of lab results. Patient had no questions.  

## 2019-10-09 NOTE — Telephone Encounter (Signed)
Pt was inform to bring the bill to the office and will take a look and try to help her

## 2019-10-10 ENCOUNTER — Telehealth: Payer: Self-pay | Admitting: Nurse Practitioner

## 2019-10-10 NOTE — Telephone Encounter (Signed)
Pt was sent a letter from financial dept. Inform them, that the application they submitted was incomplete, since they were missing some documentation at the time of the appointment, Pt need to reschedule and resubmit all new papers and application for CAFA and OC, P.S. old documents has been sent back by mail to the Pt and Pt. need to make a new appt. 

## 2019-10-12 ENCOUNTER — Ambulatory Visit
Admission: RE | Admit: 2019-10-12 | Discharge: 2019-10-12 | Disposition: A | Discharge status: Home / Self Care | Payer: No Typology Code available for payment source | Source: Ambulatory Visit | Attending: Obstetrics and Gynecology | Admitting: Obstetrics and Gynecology

## 2019-10-12 ENCOUNTER — Other Ambulatory Visit: Payer: Self-pay

## 2019-10-12 ENCOUNTER — Ambulatory Visit: Payer: Self-pay | Admitting: *Deleted

## 2019-10-12 VITALS — BP 104/72 | Temp 98.0°F | Wt 141.5 lb

## 2019-10-12 DIAGNOSIS — Z1231 Encounter for screening mammogram for malignant neoplasm of breast: Secondary | ICD-10-CM

## 2019-10-12 DIAGNOSIS — Z1239 Encounter for other screening for malignant neoplasm of breast: Secondary | ICD-10-CM

## 2019-10-12 NOTE — Patient Instructions (Signed)
Explained breast self awareness with Byrd Hesselbach Rangel-Celedon. Patient did not need a Pap smear today due to last Pap smear and HPV typing was 10/04/2019. Let her know BCCCP will cover Pap smears and HPV typing every 5 years unless has a history of abnormal Pap smears. Referred patient to the [facility] for a screening mammogram. Appointment scheduled Thursday, October 12, 2019 at 1030 on the mobile unit. Patient escorted to mobile unit following BCCCP appointment. Let patient know the Breast Center will follow up with her within the next couple weeks with results of her mammogram by letter or phone. Shaunee Rangel-Celedon verbalized understanding.  Deryck Hippler, Kathaleen Maser, RN 10:34 AM

## 2019-10-12 NOTE — Progress Notes (Signed)
Ms. Kenyah Luba is a 44 y.o. female who presents to [facility] clinic today with no complaints.    Pap Smear: Pap not smear completed today. Last Pap smear was 10/04/2019 at [facility] and was normalwith negative HPV. Per patient has no history of an abnormal Pap smear. Last Pap smear result is available in Epic.   Physical exam: Breasts Breasts symmetrical. No skin abnormalities bilateral breasts. No nipple retraction bilateral breasts. No nipple discharge bilateral breasts. No lymphadenopathy. No lumps palpated bilateral breasts. No complaints of pain or tenderness on exam.       Pelvic/Bimanual Pap is not indicated today per BCCCP guidelines.   Smoking History: Patient has never smoked.   Patient Navigation: Patient education provided. Access to services provided for patient through [employer] program. Spanish interpreter [name] from [facility] provided.    Breast and Cervical Cancer Risk Assessment: Patient does not have family history of breast cancer, known genetic mutations, or radiation treatment to the chest before age 25. Patient does not have history of cervical dysplasia, immunocompromised, or DES exposure in-utero.  Risk Assessment    Risk Scores      10/12/2019   Last edited by: Narda Rutherford, LPN   5-year risk: 0.6 %   Lifetime risk: 7.5 %         A: BCCCP exam without pap smear  P: Referred patient to the [facility] for a screeningmammogram. Appointment scheduled Thursday, October 12, 2019 at 1030 on the mobile unit.  Priscille Heidelberg, RN 10/12/2019 10:33 AM

## 2019-11-16 ENCOUNTER — Ambulatory Visit: Payer: No Typology Code available for payment source

## 2019-11-24 ENCOUNTER — Ambulatory Visit: Payer: No Typology Code available for payment source

## 2019-12-01 ENCOUNTER — Ambulatory Visit: Payer: No Typology Code available for payment source

## 2019-12-20 ENCOUNTER — Ambulatory Visit: Payer: No Typology Code available for payment source

## 2019-12-20 ENCOUNTER — Other Ambulatory Visit: Payer: Self-pay

## 2020-01-12 ENCOUNTER — Other Ambulatory Visit: Payer: Self-pay

## 2020-01-12 ENCOUNTER — Encounter: Payer: Self-pay | Admitting: Family

## 2020-01-12 ENCOUNTER — Ambulatory Visit: Payer: Self-pay | Attending: Family | Admitting: Family

## 2020-01-12 VITALS — BP 102/68 | HR 63 | Temp 98.8°F | Resp 16 | Wt 145.6 lb

## 2020-01-12 DIAGNOSIS — B351 Tinea unguium: Secondary | ICD-10-CM

## 2020-01-12 DIAGNOSIS — Z789 Other specified health status: Secondary | ICD-10-CM

## 2020-01-12 DIAGNOSIS — H0015 Chalazion left lower eyelid: Secondary | ICD-10-CM

## 2020-01-12 MED ORDER — TERBINAFINE HCL 250 MG PO TABS: 250.0000 mg | ORAL_TABLET | Freq: Every day | ORAL | 0 refills | Status: DC

## 2020-01-12 MED FILL — TERBINAFINE HCL 250 MG TABS: 250 | 30 days supply | Qty: 30 | Fill #0

## 2020-01-12 NOTE — Progress Notes (Signed)
Patient ID: Stacie Pratt, female    DOB: November 06, 1975  MRN: [id number]  CC: Nail Fungus  Subjective: Runell Pratt is a 44 y.o. female with history of hyperlipidemia who presents for nail fungus.   1. NAIL FUNGUS: 08/23/2019: Encounter with nurse practitioner Meredeth Ide. Patient with concern of fungus of right thumbnail. Prescribed Lamisil in the Emergency Room last year which she states did not provide resolution of her skin problem. She took Lamisil for 1.5 months. Prescribed Penlac. May need to see dermatology if ineffective. Patient advised to apply for financial assistance and schedule to see financial counselor.  01/12/2020: Today reports still having nail fungus of right thumbnail. Reports Lamisil seemed to help her nail fungus but she feels that it would have helped more if it had been prescribed for longer than 1 month. Reports Penlac did not help and made her nail and skin more dry therefore she quit using it. Reports she is currently using Tolnaftate cream 1% antifungal for athlete's foot and topping it with Vaseline and reports it seems to be helping, reports she has been doing this for 2 weeks.  2. EYE COMPLAINT: Location: left  Onset: 3 days  Description: swollen and teary and bump at lower eyelid  Symptoms Discharge: watery sometimes sticky  Pain: denies  Photophobia: denies Decreased Vision: blurry sometimes  URI symptoms:  Itching/Allergy symptoms: denies  Sandy or gritty feeling in eye: sometimes  Sensation of foreign body in eye: denies  Crusting of eyelid: denies   Red Flags Trauma: denies  Contact lens use: denies  Vomiting or headache: denies   Current Outpatient Medications on File Prior to Visit  Medication Sig Dispense Refill  . ciclopirox (PENLAC) 8 % solution Apply topically at bedtime. Apply over nail and surrounding skin. Apply daily over previous coat. After seven (7) days, may remove with alcohol and continue cycle. (Patient not taking:  Reported on 10/04/2019) 6.6 mL 3   No current facility-administered medications on file prior to visit.    No Known Allergies  Social History   Socioeconomic History  . Marital status: Single    Spouse name: Not on file  . Number of children: 3  . Years of education: Not on file  . Highest education level: 6th grade  Occupational History  . Not on file  Tobacco Use  . Smoking status: Never Smoker  . Smokeless tobacco: Never Used  Vaping Use  . Vaping Use: Never used  Substance and Sexual Activity  . Alcohol use: Never  . Drug use: Never  . Sexual activity: Yes    Birth control/protection: Surgical  Other Topics Concern  . Not on file  Social History Narrative  . Not on file   Social Determinants of Health   Financial Resource Strain:   . Difficulty of Paying Living Expenses: Not on file  Food Insecurity:   . Worried About Programme researcher, broadcasting/film/video in the Last Year: Not on file  . Ran Out of Food in the Last Year: Not on file  Transportation Needs: No Transportation Needs  . Lack of Transportation (Medical): No  . Lack of Transportation (Non-Medical): No  Physical Activity:   . Days of Exercise per Week: Not on file  . Minutes of Exercise per Session: Not on file  Stress:   . Feeling of Stress : Not on file  Social Connections:   . Frequency of Communication with Friends and Family: Not on file  . Frequency of Social Gatherings with Friends and Family:  Not on file  . Attends Religious Services: Not on file  . Active Member of Clubs or Organizations: Not on file  . Attends Banker Meetings: Not on file  . Marital Status: Not on file  Intimate Partner Violence:   . Fear of Current or Ex-Partner: Not on file  . Emotionally Abused: Not on file  . Physically Abused: Not on file  . Sexually Abused: Not on file    Family History  Problem Relation Age of Onset  . Healthy Mother   . Healthy Father   . Diabetes Sister     Past Surgical History:    Procedure Laterality Date  . CESAREAN SECTION     3x  . TUBAL LIGATION      ROS: Review of Systems Negative except as stated above  PHYSICAL EXAM: Vitals with BMI 01/12/2020 10/12/2019 10/04/2019  Height - - 5' 0.5"  Weight 145 lbs 10 oz 141 lbs 8 oz 140 lbs  BMI - 27.17 26.88  Systolic 102 104 884  Diastolic 68 72 72  Pulse 63 - 68    Wt Readings from Last 3 Encounters:  01/12/20 145 lb 9.6 oz (66 kg)  10/12/19 141 lb 8 oz (64.2 kg)  10/04/19 140 lb (63.5 kg)    Physical Exam General appearance - alert, well appearing, and in no distress and oriented to person, place, and time Eyes - pupils equal and reactive, extraocular eye movements intact, chalazion left lower eyelid Neck - supple, no significant adenopathy Lymphatics - no palpable lymphadenopathy, no hepatosplenomegaly Chest - clear to auscultation, no wheezes, rales or rhonchi, symmetric air entry, no tachypnea, retractions or cyanosis Heart - normal rate, regular rhythm, normal S1, S2, no murmurs, rubs, clicks or gallops Neurological - alert, oriented, normal speech, no focal findings or movement disorder noted, funduscopic exam normal, discs flat and sharp Musculoskeletal - no joint tenderness, deformity or swelling, no muscular tenderness noted, full range of motion without pain Skin - normal coloration and turgor, no rashes, no suspicious skin lesions noted NAILS: onychomycosis of the fingernails  RIGHT HAND THUMBNAIL     ASSESSMENT AND PLAN: 1. Onychomycosis of nail of digit of hand: - Patient presents with chronic onychomycosis of right hand thumbnail. - Terbinafine as prescribed for nail fungus. - Referral to Dermatology for further evaluation and management.  - Follow-up with primary provider as needed.  - Ambulatory referral to Dermatology - terbinafine (LAMISIL) 250 MG tablet; Take 1 tablet (250 mg total) by mouth daily.  Dispense: 42 tablet; Refill: 0  2. Chalazion of left lower eyelid: - Denies  pain, double vision, change in vision, nausea, vomiting, and headaches.  - Managing pain and swelling  Apply a warm, moist compress to the eyelid 4-6 times a day for 10-15 minutes at a time. This will help to open any blocked glands and to reduce redness and swelling.  Apply over-the-counter and prescription medicines only as told by your health care provider. General instructions  Do not touch the chalazion.  Do not try to remove the pus. Do not squeeze the chalazion or stick it with a pin or needle.  Do not rub your eyes.  Wash your hands often. Dry your hands with a clean towel.  Keep your face, scalp, and eyebrows clean.  Avoid wearing eye makeup.  If the chalazion does not break open (rupture) on its own, return to your health care provider.  Keep all follow-up appointments as told by your health care provider. This  is important. Contact a health care provider if:  Your eyelid has not improved in 4 weeks.  Your eyelid is getting worse.  You have a fever.  The chalazion does not rupture on its own after a month of home treatment. Get help right away if:  You have pain in your eye.  Your vision changes.  The chalazion becomes painful or red.  The chalazion gets bigger.  3. Language barrier: - [employer] participated during today's visit. Interpreter Name:Casel, ID#: [id number].    Patient was given the opportunity to ask questions.  Patient verbalized understanding of the plan and was able to repeat key elements of the plan. Patient was given clear instructions to go to Emergency Department or return to medical center if symptoms don't improve, worsen, or new problems develop.The patient verbalized understanding.  Rema Fendt, NP

## 2020-01-12 NOTE — Patient Instructions (Addendum)
Terbinafine for nail fungus. Referral to Dermatology for nail fungus. Follow-up with primary provider if left eye symptoms do not improve. Seek immediate medical evaluation for left eye if symptoms worsen and/or become severe.  Terbinafina para hongos en las uas. Remisin a Dermatologa por hongos en las uas. Haga un seguimiento con el proveedor de atencin primaria si los sntomas del ojo izquierdo no mejoran. Busque una evaluacin mdica inmediata para el ojo izquierdo si los sntomas empeoran y / o se vuelven severos.  Infeccin por hongos en las uas Fungal Nail Infection La infeccin por hongos en las uas es una infeccin frecuente de las uas de los pies o de las manos. Este trastorno [employer] uas de los pies con ms frecuencia que las uas de las manos. Generalmente afecta al dedo gordo del pie. Ms de Neomia Dear ua puede infectarse. Esta afeccin puede transmitirse de [place] persona a otra (es contagiosa). Cules son las causas? La causa de esta afeccin es un hongo. Son varios los tipos de hongos que pueden causar la infeccin. Estos hongos son frecuentes en las zonas hmedas y clidas. Si las manos o los pies entran en contacto con los hongos, se pueden introducir en una ruptura de las uas de las manos o de los pies y Games developer infeccin. Qu incrementa el riesgo? Los siguientes factores pueden hacer que usted sea ms propenso a Aeronautical engineer afeccin:  Ser hombre.  Ser Neomia Dear persona de edad avanzada.  Convivir con alguien que tiene hongos.  Caminar descalzo en zonas donde proliferan hongos, como duchas o vestuarios.  Usar zapatos y calcetines que [employer].  Tener una ua lastimada o haberse sometido a una ciruga de uas recientemente.  Tener ciertas afecciones, por ejemplo: ? Pie de atleta. ? Diabetes. ? Psoriasis. ? Mala circulacin. ? Debilitamiento del sistema de defensa del organismo (sistema inmunitario). Cules son los signos o los sntomas? Los  sntomas de esta afeccin incluyen:  Una mancha plida sobre la ua.  Engrosamiento de la ua.  Una ua que se torna amarilla o [place].  Bordes de las uas rugosos o quebradizos.  Una ua que se cae.  Una ua que se ha desprendido del lecho ungueal. Cmo se diagnostica? Esta afeccin se diagnostica mediante un examen fsico. El mdico podr tomar una muestra de la ua para examinarla y Engineer, manufacturing si tiene hongos. Cmo se trata? No es necesario realizar tratamiento si la infeccin es leve. Si tiene [employer] uas, el tratamiento puede incluir lo siguiente:  Antimicticos que se toman por boca (va oral). Deber tomar los medicamentos durante algunas semanas o meses y no ver los resultados hasta despus de un [place]. Estos medicamentos pueden tener efectos secundarios. Consulte al [employer] a los que debe estar atento.  Cremas o esmaltes de uas antimicticos. Se pueden usar junto con los medicamentos antimicticos que se administran por va oral.  Tratamiento lser de las uas.  Ciruga para extirpar la ua. Esto puede ser [employer] casos ms graves de infecciones. La infeccin puede tardar un largo tiempo en desaparecer, habitualmente hasta un ao. Adems, la infeccin puede regresar. Siga estas indicaciones en su casa: Medicamentos  Tome o aplquese los medicamentos de venta libre y los recetados solamente como se lo haya indicado el mdico.  Consulte al mdico sobre el uso de pomadas mentoladas para las uas de [place]. Cuidado de las uas  Crtese las uas con Psychologist, clinical.  Lvese y squese las [address]  y [employer].  Mantenga los pies secos: ? Use calcetines absorbentes y cmbiese los calcetines con frecuencia. ? Use un tipo de calzado que permita que el aire [place], como sandalias o zapatillas de lona. Deseche los zapatos viejos.  No use uas artificiales.  Si va al saln de esttica de uas, asegrese de  elegir uno en el que se usen instrumentos limpios.  Aplquese polvo antimictico en los pies y en los zapatos. Indicaciones generales  No comparta elementos personales como toallas o cortauas.  No camine descalzo en duchas o vestuarios.  Use guantes de goma si est trabajando con sus manos en lugares mojados.  Concurra a todas las visitas de control como se lo haya indicado el mdico. Esto es importante. Comunquese con un mdico si: La infeccin no mejora o si empeora despus de varios meses. Resumen  La infeccin por hongos en las uas es una infeccin frecuente de las uas de los pies o de las manos.  No es necesario realizar tratamiento si la infeccin es leve. Si tiene [employer] uas, el tratamiento puede incluir la administracin de medicamentos por va oral y la aplicacin de un medicamento en las uas.  La infeccin puede tardar un largo tiempo en desaparecer, habitualmente hasta un ao. Adems, la infeccin puede regresar.  Tome o aplquese los medicamentos de venta libre y los recetados solamente como se lo haya indicado el mdico.  Siga las instrucciones de cuidado de las uas a fin de ayudar a Automotive engineer que la infeccin regrese o se extienda. Esta informacin no tiene Theme park manager el consejo del mdico. Asegrese de hacerle al mdico cualquier pregunta que tenga. Document Revised: 10/21/2017 Document Reviewed: 10/21/2017 Elsevier Patient Education  2020 [employer].  Chalacin Chalazion  Un chalacin es una inflamacin o una tumoracin en el prpado. Puede afectar al prpado superior o al inferior. Cules son las causas? Esta afeccin puede ser causada por lo siguiente:  La inflamacin crnica de las glndulas del prpado.  La obstruccin de una glndula sebcea en el prpado. Cules son los signos o los sntomas? Los sntomas de esta afeccin incluyen:  Hinchazn del prpado. La hinchazn se puede extender a otras zonas alrededor del  ojo.  Un bulto duro en el prpado.  Visin borrosa. El bulto en el prpado puede puede dificultar la visin. Cmo se diagnostica? Esta afeccin se diagnostica con un examen ocular. Cmo se trata? La afeccin se trata mediante la aplicacin de compresas tibias en el prpado. Si la afeccin no mejora, el tratamiento puede incluir lo siguiente:  Medicamentos que un mdico Estate agent.  Ciruga.  Medicamentos que se Sport and exercise psychologist. Siga estas indicaciones en su casa: Control del dolor y de la hinchazn  Aplquese una compresa tibia y hmeda en el prpado 4 o 6veces al da durante 10 a cada vez. Esto ayudar a destapar las glndulas obstruidas y a Dispensing optician y la inflamacin.  Aplquese los medicamentos de venta libre y los recetados solamente como se lo haya indicado el mdico. Indicaciones generales  No se toque el chalacin.  No trate de extraer el pus. No apriete el chalacin ni lo pinche con un alfiler o una aguja.  No se frote los ojos.  Lvese las manos con frecuencia. Squelas con una toalla limpia.  Mantenga el rostro, el cuero cabelludo y las cejas limpios.  No use maquillaje.  Si el chalacin no se rompe (ruptura) por s solo,  regrese al mdico.  Concurra a todas las visitas de control como se lo haya indicado el mdico. Esto es importante. Comunquese con un mdico si:  El prpado no ha mejorado despus de 4semanas.  El prpado est empeorando.  Tiene fiebre.  El chalacin no se rompe por s solo despus de un mes de Medical laboratory scientific officer. Solicite ayuda inmediatamente si:  Environmental education officer.  Presenta cambios en la visin.  El chalacin le causa dolor o est enrojecido.  El chalacin se agranda. Resumen  Un chalacin es una inflamacin o un bulto en el prpado superior o inferior.  Puede estar causado por una inflamacin crnica o por la obstruccin de una glndula sebcea.  Aplquese una  compresa tibia y hmeda en el prpado 4 o 6veces al da durante 10 a cada vez.  Mantenga el rostro, el cuero cabelludo y las cejas limpios. Esta informacin no tiene Theme park manager el consejo del mdico. Asegrese de hacerle al mdico cualquier pregunta que tenga. Document Revised: 11/22/2017 Document Reviewed: 11/22/2017 Elsevier Patient Education  2020 [employer].

## 2020-02-01 ENCOUNTER — Other Ambulatory Visit: Payer: Self-pay | Admitting: Critical Care Medicine

## 2020-02-01 ENCOUNTER — Other Ambulatory Visit: Payer: Self-pay

## 2020-02-01 ENCOUNTER — Ambulatory Visit: Payer: Self-pay | Admitting: Critical Care Medicine

## 2020-02-01 VITALS — BP 124/84 | HR 78 | Temp 98.7°F | Resp 18 | Ht 62.0 in

## 2020-02-01 DIAGNOSIS — B029 Zoster without complications: Secondary | ICD-10-CM

## 2020-02-01 MED ORDER — TRAMADOL HCL 50 MG PO TABS: 50.0000 mg | ORAL_TABLET | Freq: Three times a day (TID) | ORAL | 0 refills | Status: DC | PRN

## 2020-02-01 MED ORDER — CALAMINE EX LOTN
1.0000 | TOPICAL_LOTION | CUTANEOUS | 0 refills | Status: DC | PRN
Start: 2020-02-01 — End: 2020-02-12

## 2020-02-01 MED ORDER — VALACYCLOVIR HCL 1 G PO TABS: 1000.0000 mg | ORAL_TABLET | Freq: Three times a day (TID) | ORAL | 0 refills | Status: DC

## 2020-02-01 NOTE — Progress Notes (Signed)
Patient complains of red space on the right side of her lower back. Patient has 3 marks in the area. 2 which looked like they have been scratched and 1 small fluid filled pimple. Patient shares she was working outside her house and may have been bit by a bug. Patient used hyrdo-cortisone OTC cream with no relief.

## 2020-02-01 NOTE — Patient Instructions (Signed)
Take valtrex one capsule three times a day for 7days  Use calamine lotion apply to back area of rash twice a day for itching  Use Tramadol 1 every 8hours as needed for severe pain  Return to see Bertram Denver in one month    Culebrilla Shingles  La culebrilla es una infeccin. Causa una erupcin en la piel dolorosa y ampollas llenas de lquido. La culebrilla es causada por el mismo germen (virus) que causa la varicela. La culebrilla solo se presenta en personas que:  Han tenido varicela.  Han recibido una inyeccin de medicamento (vacuna) para protegerse de la varicela. La culebrilla es poco frecuente en ese grupo. Los primeros sntomas de la culebrilla pueden ser picazn, hormigueo o dolor en una zona de la piel. Algunos das o semanas despus, aparece una erupcin en la piel. Es probable que la erupcin aparezca en un solo lado del cuerpo. Generalmente, la erupcin tiene la forma de una franja o banda. Con el paso del [place], la erupcin se convierte en un grupo de ampollas llenas de lquido. Las ampollas se rompen, se transforman en costras y se secan. Los medicamentos pueden ayudar a:  Teacher, early years/pre y Higher education careers adviser.  Lograr una mejora ms pronto.  Prevenir problemas a Air cabin crew. Siga estas indicaciones en su casa: Medicamentos  [employer] de venta libre y los recetados solamente como se lo haya indicado el mdico.  Pngase una crema para aliviar la picazn o para adormecer la zona donde tiene la erupcin, las ampollas o las costras. Hgalo como se lo haya indicado el mdico. Para ayudar a Associate Professor y las molestias   Pngase paos hmedos y fros (compresas fras) sobre la zona de la erupcin cutnea o las ampollas siguiendo las indicaciones del mdico.  [place] baos con agua fra pueden ayudarlo a sentirse mejor. Pruebe agregar bicarbonato de sodio o avena seca en el agua para Associate Professor. No se bae con agua caliente. Cuidado de las ampollas y la  erupcin cutnea  Mantenga la zona de la erupcin cutnea cubierta con una venda floja (vendaje).  Use ropa holgada que no roce contra la erupcin.  Mantenga la erupcin y las ampollas limpias. Para hacerlo, lave la zona con Caro Hight y agua fra siguiendo las indicaciones del mdico.  Verifique la erupcin cutnea todos los [address] para detectar signos de infeccin. Est atento a los siguientes signos: ? Aumento del enrojecimiento, la hinchazn o Chief Technology Officer. ? Lquido o sangre. ? Calor. ? Pus o mal olor.  No se rasque el rea de la erupcin. No se toque las ampollas. Para evitar rascarse: ? Tenga las uas siempre cortas y limpias. ? Si no puede dejar de rascarse, use guantes o mitones mientras duerme. Instrucciones generales  Haga reposo como se lo haya indicado el mdico.  Concurra a todas las visitas de seguimiento como se lo haya indicado el mdico. Esto es importante.  Lvese las manos frecuentemente con agua y [place]. Use desinfectante para manos si no dispone de [place] y [place]. Al hacerlo, reduce sus probabilidades de contraer una infeccin en la piel causada por grmenes (bacterias).  Su infeccin puede causar varicela en las personas que nunca tuvieron la enfermedad o que nunca recibieron la vacuna contra la varicela. Si tiene [employer] an no se formaron costras, intente no tocar a Economist ni estar cerca de Economist, especialmente: ? Los bebs. ? Las [employer]R Corporation. ? Los nios que tienen zonas de piel  enrojecida, spera o con picazn (eczema). ? [employer] de edad muy avanzada que se han sometido a trasplantes. ? [employer] tienen enfermedades a Air cabin crew (crnicas), como cncer o sndrome de inmunodeficiencia adquirida (SIDA). Comunquese con un mdico si:  El dolor no mejora con medicamentos.  El dolor no mejora despus de que se cura la erupcin cutnea.  Tiene signos de infeccin en la zona de la erupcin cutnea. Estos signos  incluyen lo siguiente: ? Aumento del enrojecimiento, hinchazn o dolor alrededor de la erupcin. ? Presenta lquido o sangre que supura de la erupcin. ? La zona de la erupcin se siente caliente al tacto. ? Pus o mal olor que sale de la erupcin. Solicite ayuda de inmediato si:  La erupcin cutnea est en el rostro o en la nariz.  Tiene dolor en el rostro o cerca de los ojos.  Pierde la sensacin en un lado del rostro.  Tiene dificultad para ver.  Siente dolor o zumbido en los odos.  Tiene prdida del gusto.  La afeccin empeora. Resumen  La culebrilla causa una erupcin en la piel dolorosa y ampollas llenas de lquido.  La culebrilla es una infeccin. Es causada por el mismo germen (virus) que causa la varicela.  Mantenga la zona de la erupcin cutnea cubierta con una venda floja (vendaje). Use ropa holgada que no roce contra la erupcin.  Si tiene [employer] an no se formaron costras, intente no tocar a Economist ni estar cerca de Economist. Esta informacin no tiene Theme park manager el consejo del mdico. Asegrese de hacerle al mdico cualquier pregunta que tenga. Document Revised: 03/31/2017 Document Reviewed: 03/31/2017 Elsevier Patient Education  2020 [employer].

## 2020-02-01 NOTE — Progress Notes (Signed)
Subjective:    Patient ID: Stacie Pratt, female    DOB: 10/27/1975, 44 y.o.   MRN: [id number]  This is a 44 year old female seen today in the mobile health clinic secondary to new onset rash that is pruritic and painful in the right lower back  Assistance with this visit given through Spanish-speaking interpreter Byrd Hesselbach  Patient states this rash has been present for approximately 3 days.  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  She is not employed.  She states she has had chickenpox when she was a child.  She has no other complaints at this time.  There is no chronic past medical history noted.   Past Medical History:  Diagnosis Date   Hyperlipidemia      Family History  Problem Relation Age of Onset   Healthy Mother    Healthy Father    Diabetes Sister      Social History   Socioeconomic History   Marital status: Single    Spouse name: Not on file   Number of children: 3   Years of education: Not on file   Highest education level: 6th grade  Occupational History   Not on file  Tobacco Use   Smoking status: Never Smoker   Smokeless tobacco: Never Used  Vaping Use   Vaping Use: Never used  Substance and Sexual Activity   Alcohol use: Never   Drug use: Never   Sexual activity: Yes    Birth control/protection: Surgical  Other Topics Concern   Not on file  Social History Narrative   Not on file   Social Determinants of Health   Financial Resource Strain:    Difficulty of Paying Living Expenses: Not on file  Food Insecurity:    Worried About Programme researcher, broadcasting/film/video in the Last Year: Not on file   [employer] of Food in the Last Year: Not on file  Transportation Needs: No Transportation Needs   Lack of Transportation (Medical): No   Lack of Transportation (Non-Medical): No  Physical Activity:    Days of Exercise per Week: Not on file   Minutes of Exercise per Session: Not on file  Stress:    Feeling of Stress : Not on file  Social  Connections:    Frequency of Communication with Friends and Family: Not on file   Frequency of Social Gatherings with Friends and Family: Not on file   Attends Religious Services: Not on file   Active Member of Clubs or Organizations: Not on file   Attends Banker Meetings: Not on file   Marital Status: Not on file  Intimate Partner Violence:    Fear of Current or Ex-Partner: Not on file   Emotionally Abused: Not on file   Physically Abused: Not on file   Sexually Abused: Not on file     No Known Allergies   Outpatient Medications Prior to Visit  Medication Sig Dispense Refill   terbinafine (LAMISIL) 250 MG tablet Take 1 tablet (250 mg total) by mouth daily. (Patient not taking: Reported on 02/01/2020) 42 tablet 0   ciclopirox (PENLAC) 8 % solution Apply topically at bedtime. Apply over nail and surrounding skin. Apply daily over previous coat. After seven (7) days, may remove with alcohol and continue cycle. (Patient not taking: Reported on 10/04/2019) 6.6 mL 3   No facility-administered medications prior to visit.    Review of Systems  Constitutional: Negative.   HENT: Negative.   Respiratory: Negative.   Cardiovascular: Negative.  Gastrointestinal: Negative.   Genitourinary: Negative.   Musculoskeletal: Negative.   Skin: Positive for rash.  Neurological: Negative.   Psychiatric/Behavioral: Negative.        Objective:   Physical Exam Vitals:   02/01/20 1712  BP: 124/84  Pulse: 78  Resp: 18  Temp: 98.7 F (37.1 C)  TempSrc: Oral  SpO2: 100%  Height: 5\' 2"  (1.575 m)    Gen: Pleasant, well-nourished, in no distress,  normal affect  ENT: No lesions,  mouth clear,  oropharynx clear, no postnasal drip  Neck: No JVD, no TMG, no carotid bruits  Lungs: No use of accessory muscles, no dullness to percussion, clear without rales or rhonchi  Cardiovascular: RRR, heart sounds normal, no murmur or gallops, no peripheral edema  Abdomen: soft and  NT, no HSM,  BS normal  Musculoskeletal: No deformities, no cyanosis or clubbing  Neuro: alert, non focal  Skin: Warm, there is a classic dermatomal distribution of herpetic lesions seen in the right lower back along the lumbar distribution with vesicles that are in various phases of development and associated erythema and excoriation  No results found.        Assessment & Plan:  I personally reviewed all images and lab data in the [facility] system as well as any outside material available during this office visit and agree with the  radiology impressions.   Herpes zoster without complication Herpes zoster involving dermatomal distribution right lower lumbar area  Plan is to give Valtrex 1000 mg 3 times a day for a 1 week course  Apply calamine lotion for itching  Follow-up with primary care   Dynasia was seen today for rash.  Diagnoses and all orders for this visit:  Herpes zoster without complication  Other orders -     valACYclovir (VALTREX) 1000 MG tablet; Take 1 tablet (1,000 mg total) by mouth 3 (three) times daily for 7 days. -     calamine lotion; Apply 1 application topically as needed for itching. -     traMADol (ULTRAM) 50 MG tablet; Take 1 tablet (50 mg total) by mouth every 8 (eight) hours as needed for up to 5 days for severe pain.

## 2020-02-02 ENCOUNTER — Telehealth: Payer: Self-pay | Admitting: Nurse Practitioner

## 2020-02-02 DIAGNOSIS — B029 Zoster without complications: Secondary | ICD-10-CM | POA: Insufficient documentation

## 2020-02-02 MED FILL — traMADol HCL 50 MG TABS: 50 | 5 days supply | Qty: 15 | Fill #0

## 2020-02-02 MED FILL — valACYclovir HCL 1 GM TABS: 1 | 7 days supply | Qty: 21 | Fill #0

## 2020-02-02 NOTE — Assessment & Plan Note (Signed)
Herpes zoster involving dermatomal distribution right lower lumbar area  Plan is to give Valtrex 1000 mg 3 times a day for a 1 week course  Apply calamine lotion for itching  Follow-up with primary care

## 2020-02-02 NOTE — Telephone Encounter (Signed)
Pt states she was seeing at the [facility] and was told she has shingles but didn't understand further and would like a call back from the nurse explaining treatment needed. Please advise. Thank you

## 2020-02-02 NOTE — Telephone Encounter (Signed)
Spoke w/ pt via [employer] [place], [id number]) and answered all questions regarding treatment and home care, prevention of spread, pt verbalized understanding of all instructions

## 2020-02-09 ENCOUNTER — Ambulatory Visit: Payer: No Typology Code available for payment source | Admitting: Nurse Practitioner

## 2020-02-12 ENCOUNTER — Other Ambulatory Visit: Payer: Self-pay | Admitting: Nurse Practitioner

## 2020-02-12 ENCOUNTER — Ambulatory Visit: Payer: Self-pay | Attending: Nurse Practitioner | Admitting: Nurse Practitioner

## 2020-02-12 ENCOUNTER — Encounter: Payer: Self-pay | Admitting: Nurse Practitioner

## 2020-02-12 ENCOUNTER — Other Ambulatory Visit: Payer: Self-pay

## 2020-02-12 DIAGNOSIS — F32A Depression, unspecified: Secondary | ICD-10-CM

## 2020-02-12 DIAGNOSIS — F419 Anxiety disorder, unspecified: Secondary | ICD-10-CM

## 2020-02-12 MED ORDER — BUSPIRONE HCL 15 MG PO TABS: 15.0000 mg | ORAL_TABLET | Freq: Three times a day (TID) | ORAL | 1 refills | Status: DC | PRN

## 2020-02-12 MED FILL — busPIRone HCL 15 MG TABS: 15 | 20 days supply | Qty: 60 | Fill #0

## 2020-02-12 NOTE — Progress Notes (Signed)
Virtual Visit via Telephone Note Due to national recommendations of social distancing due to COVID 19, telehealth visit is felt to be most appropriate for this patient at this time.  I discussed the limitations, risks, security and privacy concerns of performing an evaluation and management service by telephone and the availability of in person appointments. I also discussed with the patient that there may be a patient responsible charge related to this service. The patient expressed understanding and agreed to proceed.    I connected with Stacie Pratt on 02/12/20  at   9:30 AM EDT  EDT by telephone and verified that I am speaking with the correct person using two identifiers.   Consent I discussed the limitations, risks, security and privacy concerns of performing an evaluation and management service by telephone and the availability of in person appointments. I also discussed with the patient that there may be a patient responsible charge related to this service. The patient expressed understanding and agreed to proceed.   Location of Patient: Private  Residence   Location of Provider: [facility]    Persons participating in Telemedicine visit: Stacie Denver FNP-BC [facility] CMA Byrd Hesselbach [place]  Spanish Interpreter ID# [id number]   History of Present Illness: Telemedicine visit for: Depression   Anxiety and Depression She has a history of anxiety and depression. Was previously taking hydroxyzine 25 mg TID. She did not start taking it as she states she was told by the ED provider that it was habit forming.  She is requesting a medication for stress. I have instructed her that an antidepressant is only going to help with her physical symptoms.  Depression screen [facility] 2/9 02/12/2020 02/01/2020 01/12/2020 10/04/2019 08/23/2019  Decreased Interest 2 1 2 1  0  Down, Depressed, Hopeless 2 1 1 1  0  PHQ - 2 Score 4 2 3 2  0  Altered sleeping 2 0 0 0 0  Tired,  decreased energy 2 2 1 1  0  Change in appetite 2 1 1  0 0  Feeling bad or failure about yourself  2 2 2 1  0  Trouble concentrating 2 2 1  0 0  Moving slowly or fidgety/restless 0 2 1 0 0  Suicidal thoughts 0 2 1 1  0  PHQ-9 Score 14 13 10 5  0   GAD 7 : Generalized Anxiety Score 02/12/2020 02/01/2020 10/04/2019  Nervous, Anxious, on Edge 1 3 2   Control/stop worrying 1 3 1   Worry too much - different things 1 2 1   Trouble relaxing 1 0 1  Restless 0 2 0  Easily annoyed or irritable 2 3 2   Afraid - awful might happen 0 0 0  Total GAD 7 Score 6 13 7        Past Medical History:  Diagnosis Date  . Hyperlipidemia     Past Surgical History:  Procedure Laterality Date  . CESAREAN SECTION     3x  . TUBAL LIGATION      Family History  Problem Relation Age of Onset  . Healthy Mother   . Healthy Father   . Diabetes Sister     Social History   Socioeconomic History  . Marital status: Single    Spouse name: Not on file  . Number of children: 3  . Years of education: Not on file  . Highest education level: 6th grade  Occupational History  . Not on file  Tobacco Use  . Smoking status: Never Smoker  . Smokeless tobacco: Never Used  Vaping Use  . Vaping Use: Never used  Substance and Sexual Activity  . Alcohol use: Never  . Drug use: Never  . Sexual activity: Yes    Birth control/protection: Surgical  Other Topics Concern  . Not on file  Social History Narrative  . Not on file   Social Determinants of Health   Financial Resource Strain:   . Difficulty of Paying Living Expenses: Not on file  Food Insecurity:   . Worried About Programme researcher, broadcasting/film/video in the Last Year: Not on file  . Ran Out of Food in the Last Year: Not on file  Transportation Needs: No Transportation Needs  . Lack of Transportation (Medical): No  . Lack of Transportation (Non-Medical): No  Physical Activity:   . Days of Exercise per Week: Not on file  . Minutes of Exercise per Session: Not on file   Stress:   . Feeling of Stress : Not on file  Social Connections:   . Frequency of Communication with Friends and Family: Not on file  . Frequency of Social Gatherings with Friends and Family: Not on file  . Attends Religious Services: Not on file  . Active Member of Clubs or Organizations: Not on file  . Attends Banker Meetings: Not on file  . Marital Status: Not on file     Observations/Objective: Awake, alert and oriented x 3   ROS  Assessment and Plan: [name] was seen today for follow-up.  Diagnoses and all orders for this visit:  Anxiety and depression -     busPIRone (BUSPAR) 15 MG tablet; Take 1 tablet (15 mg total) by mouth 3 (three) times daily as needed. She declines SSRI today   Follow Up Instructions Return in about 3 months (around 05/14/2020).     I discussed the assessment and treatment plan with the patient. The patient was provided an opportunity to ask questions and all were answered. The patient agreed with the plan and demonstrated an understanding of the instructions.   The patient was advised to call back or seek an in-person evaluation if the symptoms worsen or if the condition fails to improve as anticipated.  I provided 18  minutes of non-face-to-face time during this encounter including median intraservice time, reviewing previous notes, labs, imaging, medications and explaining diagnosis and management.  Claiborne Rigg, FNP-BC

## 2020-03-12 ENCOUNTER — Ambulatory Visit: Payer: No Typology Code available for payment source | Admitting: Nurse Practitioner

## 2020-03-13 ENCOUNTER — Other Ambulatory Visit: Payer: Self-pay | Admitting: Nurse Practitioner

## 2020-03-13 ENCOUNTER — Other Ambulatory Visit: Payer: Self-pay

## 2020-03-13 ENCOUNTER — Telehealth: Payer: Self-pay

## 2020-03-13 ENCOUNTER — Ambulatory Visit: Payer: Self-pay | Attending: Nurse Practitioner | Admitting: Nurse Practitioner

## 2020-03-13 ENCOUNTER — Encounter: Payer: Self-pay | Admitting: Nurse Practitioner

## 2020-03-13 VITALS — BP 106/72 | HR 62 | Temp 97.7°F | Ht 61.0 in | Wt 147.2 lb

## 2020-03-13 DIAGNOSIS — B029 Zoster without complications: Secondary | ICD-10-CM

## 2020-03-13 DIAGNOSIS — F32A Depression, unspecified: Secondary | ICD-10-CM

## 2020-03-13 DIAGNOSIS — Z13228 Encounter for screening for other metabolic disorders: Secondary | ICD-10-CM

## 2020-03-13 DIAGNOSIS — Z23 Encounter for immunization: Secondary | ICD-10-CM

## 2020-03-13 DIAGNOSIS — K5904 Chronic idiopathic constipation: Secondary | ICD-10-CM

## 2020-03-13 DIAGNOSIS — F419 Anxiety disorder, unspecified: Secondary | ICD-10-CM

## 2020-03-13 MED ORDER — HYDROXYZINE HCL 10 MG PO TABS: 10.0000 mg | ORAL_TABLET | Freq: Three times a day (TID) | ORAL | 1 refills | Status: DC | PRN

## 2020-03-13 MED ORDER — SENNOSIDES-DOCUSATE SODIUM 8.6-50 MG PO TABS: 2.0000 | ORAL_TABLET | Freq: Two times a day (BID) | ORAL | 2 refills | Status: AC | PRN

## 2020-03-13 MED FILL — hydrOXYzine HCL 10 MG TABS: 10 | 20 days supply | Qty: 60 | Fill #0

## 2020-03-13 NOTE — Telephone Encounter (Signed)
Pt would like a dental referral if possible.  Copied from CRM [id number]. Topic: Referral - Request for Referral >> Mar 13, 2020  3:26 PM Randol Kern wrote: Has patient seen PCP for this complaint? Yes.   *If NO, is insurance requiring patient see PCP for this issue before PCP can refer them? Referral for which specialty: Dental  Preferred provider/office: Highest recommended  Reason for referral: Cleaning/Check Up

## 2020-03-13 NOTE — Progress Notes (Signed)
Assessment & Plan:  Stacie Pratt was seen today for follow-up.  Diagnoses and all orders for this visit:  Herpes zoster without complication Symptoms resolved  Anxiety and depression -     Ambulatory referral to Integrated Behavioral Health -     hydrOXYzine (ATARAX/VISTARIL) 10 MG tablet; Take 1 tablet (10 mg total) by mouth 3 (three) times daily as needed for anxiety.  Screening for metabolic disorder -     Basic metabolic panel  Chronic idiopathic constipation -     senna-docusate (SENOKOT-S) 8.6-50 MG tablet; Take 2 tablets by mouth 2 (two) times daily as needed for mild constipation.  Need for immunization against influenza -     Flu Vaccine QUAD 36+ mos IM    Patient has been counseled on age-appropriate routine health concerns for screening and prevention. These are reviewed and up-to-date. Referrals have been placed accordingly. Immunizations are up-to-date or declined.    Subjective:   Chief Complaint  Patient presents with   Follow-up    Pt. is here to follow up on shingles.    HPI Stacie Pratt 44 y.o. female presents to office today for follow up to Herpes Zoster. She was diagnosed with shingles 02-01-2020 and treated with valtrex, tramadol and calamine lotion for itching. Today she states her pain has almost completely resolved. Vesicles are no longer present.   VRI was used to communicate directly with patient for the entire encounter including providing detailed patient instructions.    Anxiety and Depression  Declines SSRI. Does not feel buspar was effective. Will try prn hydroxyzine.  Depression screen [facility] 2/9 02/12/2020 02/01/2020 01/12/2020 10/04/2019 08/23/2019  Decreased Interest 2 1 2 1  0  Down, Depressed, Hopeless 2 1 1 1  0  PHQ - 2 Score 4 2 3 2  0  Altered sleeping 2 0 0 0 0  Tired, decreased energy 2 2 1 1  0  Change in appetite 2 1 1  0 0  Feeling bad or failure about yourself  2 2 2 1  0  Trouble concentrating 2 2 1  0 0  Moving slowly or  fidgety/restless 0 2 1 0 0  Suicidal thoughts 0 2 1 1  0  PHQ-9 Score 14 13 10 5  0   GAD 7 : Generalized Anxiety Score 02/12/2020 02/01/2020 10/04/2019  Nervous, Anxious, on Edge 1 3 2   Control/stop worrying 1 3 1   Worry too much - different things 1 2 1   Trouble relaxing 1 0 1  Restless 0 2 0  Easily annoyed or irritable 2 3 2   Afraid - awful might happen 0 0 0  Total GAD 7 Score 6 13 7     Review of Systems  Constitutional: Negative for fever, malaise/fatigue and weight loss.  HENT: Negative.  Negative for nosebleeds.   Eyes: Negative.  Negative for blurred vision, double vision and photophobia.  Respiratory: Negative.  Negative for cough and shortness of breath.   Cardiovascular: Negative.  Negative for chest pain, palpitations and leg swelling.  Gastrointestinal: Positive for constipation (chronic. Goes 2-3 days without having a bowel movement. ). Negative for heartburn, nausea and vomiting.  Musculoskeletal: Negative.  Negative for myalgias.  Neurological: Negative.  Negative for dizziness, focal weakness, seizures and headaches.  Psychiatric/Behavioral: Negative.  Negative for suicidal ideas.    Past Medical History:  Diagnosis Date   Hyperlipidemia     Past Surgical History:  Procedure Laterality Date   CESAREAN SECTION     3x   TUBAL LIGATION      Family  History  Problem Relation Age of Onset   Healthy Mother    Healthy Father    Diabetes Sister     Social History Reviewed with no changes to be made today.   Outpatient Medications Prior to Visit  Medication Sig Dispense Refill   busPIRone (BUSPAR) 15 MG tablet Take 1 tablet (15 mg total) by mouth 3 (three) times daily as needed. (Patient not taking: Reported on 03/13/2020) 60 tablet 1   No facility-administered medications prior to visit.    No Known Allergies     Objective:    BP 106/72 (BP Location: Left Arm, Patient Position: Sitting, Cuff Size: Normal)    Pulse 62    Temp 97.7 F (36.5 C)  (Temporal)    Ht 5\' 1"  (1.549 m)    Wt 147 lb 3.2 oz (66.8 kg)    SpO2 99%    BMI 27.81 kg/m  Wt Readings from Last 3 Encounters:  03/13/20 147 lb 3.2 oz (66.8 kg)  01/12/20 145 lb 9.6 oz (66 kg)  10/12/19 141 lb 8 oz (64.2 kg)    Physical Exam Vitals and nursing note reviewed.  Constitutional:      Appearance: She is well-developed.  HENT:     Head: Normocephalic and atraumatic.  Cardiovascular:     Rate and Rhythm: Normal rate and regular rhythm.     Heart sounds: Normal heart sounds. No murmur heard.  No friction rub. No gallop.   Pulmonary:     Effort: Pulmonary effort is normal. No tachypnea or respiratory distress.     Breath sounds: Normal breath sounds. No decreased breath sounds, wheezing, rhonchi or rales.  Chest:     Chest wall: No tenderness.  Abdominal:     General: Bowel sounds are normal.     Palpations: Abdomen is soft.  Musculoskeletal:        General: Normal range of motion.     Cervical back: Normal range of motion.  Skin:    General: Skin is warm and dry.  Neurological:     Mental Status: She is alert and oriented to person, place, and time.     Coordination: Coordination normal.  Psychiatric:        Behavior: Behavior normal. Behavior is cooperative.        Thought Content: Thought content normal.        Judgment: Judgment normal.          Patient has been counseled extensively about nutrition and exercise as well as the importance of adherence with medications and regular follow-up. The patient was given clear instructions to go to ER or return to medical center if symptoms don't improve, worsen or new problems develop. The patient verbalized understanding.   Follow-up: Return if symptoms worsen or fail to improve.   10/14/19, FNP-BC [facility] [place], [place]   03/13/2020, 11:13 PM

## 2020-03-14 LAB — BASIC METABOLIC PANEL
BUN/Creatinine Ratio: 13 (ref 9–23)
BUN: 11 mg/dL (ref 6–24)
CO2: 25 mmol/L (ref 20–29)
Calcium: 9.2 mg/dL (ref 8.7–10.2)
Chloride: 100 mmol/L (ref 96–106)
Creatinine, Ser: 0.83 mg/dL (ref 0.57–1.00)
GFR calc Af Amer: 98 mL/min/{1.73_m2} (ref >59)
GFR calc non Af Amer: 85 mL/min/{1.73_m2} (ref >59)
Glucose: 81 mg/dL (ref 65–99)
Potassium: 4.5 mmol/L (ref 3.5–5.2)
Sodium: 136 mmol/L (ref 134–144)

## 2020-03-19 ENCOUNTER — Other Ambulatory Visit: Payer: Self-pay | Admitting: Nurse Practitioner

## 2020-03-19 ENCOUNTER — Telehealth: Payer: Self-pay | Admitting: Nurse Practitioner

## 2020-03-19 DIAGNOSIS — Z7689 Persons encountering health services in other specified circumstances: Secondary | ICD-10-CM

## 2020-03-19 NOTE — Telephone Encounter (Signed)
I spoke to patient regarding an appointment with [facility] . Thank you .

## 2020-03-19 NOTE — Telephone Encounter (Signed)
Copied from CRM [id number]. Topic: General - Inquiry >> Mar 19, 2020 11:48 AM Adrian Prince D wrote: Reason for CRM: Patient called and asked for Arna Medici that makes referral appointments and she would like for Arna Medici to give her a call back. She can be reached at [phone]. Please advise

## 2020-03-25 ENCOUNTER — Telehealth: Payer: Self-pay | Admitting: Nurse Practitioner

## 2020-03-25 NOTE — Telephone Encounter (Signed)
Pt would like a call from a nurse regarding her bloodwork from the 17th and an explanation. Please advise and thank you

## 2020-03-28 NOTE — Telephone Encounter (Signed)
Spoke to patient. Patient was informed on lab results. Spanish pacific interpreter assist w/ the call.

## 2020-04-30 ENCOUNTER — Other Ambulatory Visit: Payer: Self-pay | Admitting: Dermatology

## 2020-05-01 MED FILL — TERBINAFINE HCL 250 MG TABS: 250 | 14 days supply | Qty: 14 | Fill #0

## 2020-05-03 MED FILL — CICLOPIROX 8% SOLUTION: 8 | 30 days supply | Qty: 7 | Fill #1

## 2020-05-22 ENCOUNTER — Ambulatory Visit: Payer: Self-pay | Admitting: *Deleted

## 2020-05-22 NOTE — Telephone Encounter (Signed)
Patient is calling to report headache and ear pain. Call to office- they took call to schedule patient.  Reason for Disposition . [1] MODERATE headache (e.g., interferes with normal activities) AND [2] present > 24 hours AND [3] unexplained  (Exceptions: analgesics not tried, typical migraine, or headache part of viral illness)  Answer Assessment - Initial Assessment Questions 1. LOCATION: "Where does it hurt?"      Left upper side 2. ONSET: "When did the headache start?" (Minutes, hours or days)      yesterday 3. PATTERN: "Does the pain come and go, or has it been constant since it started?"    2 times last week and yesterday- comes and goes 4. SEVERITY: "How bad is the pain?" and "What does it keep you from doing?"  (e.g., Scale 1-10; mild, moderate, or severe)   - MILD (1-3): doesn't interfere with normal activities    - MODERATE (4-7): interferes with normal activities or awakens from sleep    - SEVERE (8-10): excruciating pain, unable to do any normal activities        Moderate today- yesterday it was worse 5. RECURRENT SYMPTOM: "Have you ever had headaches before?" If Yes, ask: "When was the last time?" and "What happened that time?"      Yes- no treatment 6. CAUSE: "What do you think is causing the headache?"     No-not sure 7. MIGRAINE: "Have you been diagnosed with migraine headaches?" If Yes, ask: "Is this headache similar?"      no 8. HEAD INJURY: "Has there been any recent injury to the head?"      no 9. OTHER SYMPTOMS: "Do you have any other symptoms?" (fever, stiff neck, eye pain, sore throat, cold symptoms)     Ear pain 10. PREGNANCY: "Is there any chance you are pregnant?" "When was your last menstrual period?"       Not asked  Protocols used: HEADACHE-A-AH

## 2020-05-23 ENCOUNTER — Other Ambulatory Visit: Payer: Self-pay

## 2020-05-23 ENCOUNTER — Ambulatory Visit: Payer: Self-pay | Attending: Family Medicine | Admitting: Family Medicine

## 2020-05-23 ENCOUNTER — Encounter: Payer: Self-pay | Admitting: Family Medicine

## 2020-05-23 VITALS — BP 100/68 | HR 67 | Resp 16 | Wt 140.0 lb

## 2020-05-23 DIAGNOSIS — G44209 Tension-type headache, unspecified, not intractable: Secondary | ICD-10-CM

## 2020-05-23 NOTE — Patient Instructions (Signed)
Dolor de Training and development officer cervicognico Cervicogenic Headache  Un dolor de Training and development officer cervicognico es un dolor de cabeza causado por una afeccin que afecta los huesos y los tejidos del cuello (columna vertebral cervical). En un dolor de Wellsite geologist, Chief Technology Officer se desplaza desde el cuello hasta la cabeza. La mayora de los dolores de cabeza cervicognicos comienza en la parte superior del cuello en los primeros tres huesos del cuello (vrtebras cervicales). Se diagnostica un dolor de cabeza cervicognico cuando la causa puede encontrarse en la columna vertebral cervical y se pueden descartar otras causas del dolor de [place]. Cules son las causas? La causa ms frecuente de esta afeccin es un traumatismo de la columna vertebral cervical, por ejemplo, un latigazo cervical. Otras causas son las siguientes:  Artritis.  Un hueso roto Customer service manager).  Infeccin.  Tumor. Cules son los signos o sntomas? Los sntomas ms frecuentes son dolor de cuello y de [place]. El dolor a menudo se Surveyor, mining un lado. En algunos casos, puede tener dolor de cabeza sin Financial risk analyst en el cuello. El dolor puede sentirse en el cuello, la espalda o un lado de la cabeza, la cara o detrs de los ojos. Otros sntomas pueden incluir los siguientes:  Movimiento limitado del cuello.  Dolor en los brazos o los hombros. Cmo se diagnostica? Esta afeccin se puede diagnosticar en funcin de lo siguiente:  Sus sntomas.  Un examen fsico.  Una inyeccin que interrumpe las seales nerviosas (bloqueo nervioso).  Pruebas de diagnstico por imgenes, por ejemplo: ? Radiografas. ? Exploracin por tomografa computarizada (TC). ? Resonancia magntica (RM). Cmo se trata? El tratamiento de esta afeccin puede depender de la enfermedad subyacente. El tratamiento puede incluir:  Medicamentos, como por ejemplo: ? Antiinflamatorios no esteroides (AINE). ? Relajantes musculares.  Fisioterapia.  Terapia de  masajes.  Tratamientos complementarios, tales como: ? Biorretroalimentacin. ? Meditacin. ? Acupuntura.  Inyecciones de bloqueo nervioso.  Inyecciones de toxina botulnica. Su plan de tratamiento puede incluir trabajar con un equipo de control del dolor, que incluye su mdico de cabecera, un especialista en control del dolor, un neurlogo y un fisioterapeuta. Siga estas instrucciones en su casa:  Tome los medicamentos de venta libre y los recetados solamente como se lo haya indicado el mdico.  Sports administrator ejercicios en su casa como se lo haya indicado el fisioterapeuta.  Retome sus actividades normales como se lo haya indicado el mdico. Pregntele al mdico qu actividades son seguras para usted. Evite las [employer] dolores de [place].  Tenga un buen apoyo para el cuello y [place] en su hogar y en el [place].  Concurra a todas las visitas de [address] se lo haya indicado el mdico. Esto es importante. Comunquese con un mdico si tiene:  Dolores de cabeza que empeoran y suceden con mayor frecuencia.  Dolores de cabeza con: ? Grant Ruts. ? Entumecimiento. ? Debilidad. ? Mareos. ? Nuseas o vmitos. Solicite ayuda de inmediato si:  Tiene un dolor de cabeza muy intenso y repentino. Resumen  Un dolor de cabeza cervicognico es un dolor de cabeza causado por una afeccin que afecta los huesos y los tejidos de la columna vertebral cervical.  El mdico puede diagnosticar esta afeccin mediante un examen fsico, un bloqueo nervioso y estudios de diagnstico por imgenes.  El tratamiento puede incluir medicamentos para reducir Chief Technology Officer y la inflamacin, fisioterapia e inyecciones de bloqueo nervioso.  Tratamientos complementarios, como la acupuntura y la [place], pueden agregarse a otros tratamientos.  Su plan de  tratamiento puede incluir trabajar con un equipo de control del dolor, que incluye su mdico de cabecera, un especialista en  control del dolor, un neurlogo y un fisioterapeuta. Esta informacin no tiene Theme park manager el consejo del mdico. Asegrese de hacerle al mdico cualquier pregunta que tenga. Document Revised: 02/26/2020 Document Reviewed: 02/26/2020 Elsevier Patient Education  2021 [employer].

## 2020-05-23 NOTE — Progress Notes (Signed)
Stacie Pratt, is a 45 y.o. female   PVV:[id number]  LJQ:[id number]  DOB - 12-20-1975  CC:  Chief Complaint  Patient presents with  . Headache  . Ear Pain       HPI: SUBJECTIVE: Stacie Pratt is a 45 y.o. female who complains of L sided headaches for 2 day(s). Description of pain: sharp pain, unilateral in the left temporal area with occasional radiation to [place] area.  Duration of individual headaches: momentary  second(s), frequency infrequently. Associated symptoms: none  . Pain relief: OTC NSAID's. Precipitating factors: patient is aware of none. She denies a history of recent head injury, ear injury/trauma   Review of Systems: Constitutional: Negative for fever, chills, diaphoresis, activity change, appetite change and fatigue. HENT: Negative for ear pain, nosebleeds, congestion, facial swelling, rhinorrhea, neck pain, neck stiffness and ear discharge.No hearing loss , or rash No mouth or throat pain  Eyes: Negative for pain, discharge, redness, itching and visual disturbance. Respiratory: Negative for cough, choking, chest tightness, shortness of breath, wheezing and stridor.  Musculoskeletal: Negative for back pain, joint swelling, arthralgia and gait problem. Neurological: Negative for dizziness, tremors, seizures, syncope, facial asymmetry, speech difficulty, weakness, light-headedness, numbness  Prior neurological history: negative for no neurological problems.  Neurologic Review of Systems - no TIA or stroke-like symptoms, no amaurosis, diplopia, abnormal speech, unilateral numbness or weakness, stable ,  Psychiatric/Behavioral: Negative for hallucinations, behavioral problems, confusion, dysphoric mood, decreased concentration and agitation.  Hx of anxiety /stress but stable sxs   Is much better today after taking otc nsaids.   Scheduled Meds: Continuous Infusions: PRN Meds:    Family History  Problem Relation Age of Onset  . Healthy Mother   .  Healthy Father   . Diabetes Sister    Social History   Socioeconomic History  . Marital status: Single    Spouse name: Not on file  . Number of children: 3  . Years of education: Not on file  . Highest education level: 6th grade  Occupational History  . Not on file  Tobacco Use  . Smoking status: Never Smoker  . Smokeless tobacco: Never Used  Vaping Use  . Vaping Use: Never used  Substance and Sexual Activity  . Alcohol use: Never  . Drug use: Never  . Sexual activity: Yes    Birth control/protection: Surgical  Other Topics Concern  . Not on file  Social History Narrative  . Not on file   Social Determinants of Health   Financial Resource Strain: Not on file  Food Insecurity: Not on file  Transportation Needs: No Transportation Needs  . Lack of Transportation (Medical): No  . Lack of Transportation (Non-Medical): No  Physical Activity: Not on file  Stress: Not on file  Social Connections: Not on file  Intimate Partner Violence: Not on file    Review of Systems: Constitutional: Negative for fever, chills, diaphoresis, activity change, appetite change and fatigue. HENT: Negative for ear pain, nosebleeds, congestion, facial swelling, rhinorrhea, neck pain, neck stiffness and ear discharge.  Eyes: Negative for pain, discharge, redness, itching and visual disturbance. Respiratory: Negative for cough, choking, chest tightness, shortness of breath, wheezing and stridor.  Cardiovascular: Negative for chest pain, palpitations and leg swelling. Gastrointestinal: Negative for abdominal distention. Genitourinary: Negative for dysuria, urgency, frequency, hematuria, flank pain, decreased urine volume, difficulty urinating and dyspareunia.  Musculoskeletal: Negative for back pain, joint swelling, arthralgia and gait problem. Neurological: Negative for dizziness, tremors, seizures, syncope, facial asymmetry, speech difficulty, weakness, light-headedness, numbness  and headaches.   Hematological: Negative for adenopathy. Does not bruise/bleed easily. Psychiatric/Behavioral: Negative for hallucinations, behavioral problems, confusion, dysphoric mood, decreased concentration and agitation.    Objective:   Vitals:   05/23/20 1338  BP: 100/68  Pulse: 67  Resp: 16  SpO2: 100%      Lab Results  Component Value Date   WBC 4.5 08/29/2019   HGB 13.6 08/29/2019   HCT 40.1 08/29/2019   MCV 91 08/29/2019   PLT 237 08/29/2019   Lab Results  Component Value Date   CREATININE 0.83 03/13/2020   BUN 11 03/13/2020   NA 136 03/13/2020   K 4.5 03/13/2020   CL 100 03/13/2020   CO2 25 03/13/2020    Lab Results  Component Value Date   HGBA1C 5.6 08/29/2019   Lipid Panel     Component Value Date/Time   CHOL 233 (H) 08/29/2019 1015   TRIG 130 08/29/2019 1015   HDL 52 08/29/2019 1015   CHOLHDL 4.5 (H) 08/29/2019 1015   LDLCALC 158 (H) 08/29/2019 1015         OBJECTIVE:   Physical Exam: Constitutional: Patient appears well-developed and well-nourished. No distress. HENT: Normocephalic, atraumatic, External right and left ear normal. Oropharynx is clear and moist.  Eyes: Conjunctivae and EOM are normal. PERRLA, no scleral icterus.Fundi Flat Neck: Normal ROM. Neck supple. No JVD. No tracheal deviation. No thyromegaly. CVS: RRR, S1/S2 +, no murmurs, no gallops, no carotid bruit.  Pulmonary: Effort and breath sounds normal, no stridor, rhonchi, wheezes, rales.  Musculoskeletal: Normal range of motion. No edema and no tenderness. No TMJ sxs noted on exam L paracervical spasm No ttp c spine Lymphadenopathy: No lymphadenopathy noted, cervical, inguinal or axillary Neuro: Alert. Normal reflexes, muscle tone coordination. No cranial nerve deficit. Skin: Skin is warm and dry. No rash noted. Not diaphoretic. No erythema. No pallor. Psychiatric: Normal mood and affect. Behavior, judgment, thought content normal.    Assessment and plan:   1. Muscle tension  headache ASSESSMENT: tension headache   PLAN: Recommendations:  1) Reassurance - has improved  2) NSAID as directed ( 600 mg ibuprofen tid x 1-2 days with food ) 3) Heat pack to Left cervical neck muscles 5-10 minutes tid prn  4) Cervical pillow  See orders for this visit as documented in the electronic medical record.   No follow-ups on file.  The patient was given clear instructions to go to ER or return to medical center if symptoms don't improve, worsen or new problems develop. The patient verbalized understanding.      This note has been created with Education officer, environmental. Any transcriptional errors are unintentional.    Liyat Faulkenberry Sinclair Grooms, MD, [facility] [place], [place] [phone]   05/23/2020, 2:14 PM

## 2020-05-23 NOTE — Progress Notes (Signed)
Having a headache and states she has ear pain as well. Since Tuesday.

## 2020-07-17 ENCOUNTER — Ambulatory Visit: Payer: No Typology Code available for payment source

## 2020-07-18 ENCOUNTER — Telehealth: Payer: Self-pay | Admitting: Nurse Practitioner

## 2020-07-18 NOTE — Telephone Encounter (Signed)
Copied from CRM [id number]. Topic: General - Other >> Jul 16, 2020  2:33 PM Leafy Ro wrote: reason for CRM:  Pt would like Stacie Pratt to return her call concerning financial form

## 2020-07-18 NOTE — Telephone Encounter (Signed)
I return Pt call, I explain about the new bill that [facility] is sending to the Pt that has no insurence

## 2020-07-27 ENCOUNTER — Other Ambulatory Visit: Payer: Self-pay

## 2020-08-23 ENCOUNTER — Other Ambulatory Visit: Payer: Self-pay

## 2020-08-23 ENCOUNTER — Ambulatory Visit: Payer: Self-pay | Attending: Nurse Practitioner | Admitting: Nurse Practitioner

## 2020-08-23 ENCOUNTER — Encounter: Payer: Self-pay | Admitting: Nurse Practitioner

## 2020-08-23 VITALS — BP 104/71 | HR 72 | Resp 18 | Ht 61.0 in | Wt 138.8 lb

## 2020-08-23 DIAGNOSIS — B352 Tinea manuum: Secondary | ICD-10-CM

## 2020-08-23 DIAGNOSIS — F32A Depression, unspecified: Secondary | ICD-10-CM

## 2020-08-23 DIAGNOSIS — F419 Anxiety disorder, unspecified: Secondary | ICD-10-CM

## 2020-08-23 DIAGNOSIS — Z13220 Encounter for screening for lipoid disorders: Secondary | ICD-10-CM

## 2020-08-23 DIAGNOSIS — Z131 Encounter for screening for diabetes mellitus: Secondary | ICD-10-CM

## 2020-08-23 DIAGNOSIS — Z13228 Encounter for screening for other metabolic disorders: Secondary | ICD-10-CM

## 2020-08-23 DIAGNOSIS — Z1211 Encounter for screening for malignant neoplasm of colon: Secondary | ICD-10-CM

## 2020-08-23 DIAGNOSIS — Z130 Encounter for screening for diseases of the blood and blood-forming organs and certain disorders involving the immune mechanism: Secondary | ICD-10-CM

## 2020-08-23 MED ORDER — HYDROXYZINE HCL 10 MG PO TABS
ORAL_TABLET | ORAL | 1 refills | Status: DC
  Filled 2020-08-23: qty 60, 20d supply, fill #0

## 2020-08-23 NOTE — Progress Notes (Signed)
Assessment & Plan:  Stacie Pratt was seen today for nail problem.  Diagnoses and all orders for this visit:  Tinea manuum -     Ambulatory referral to Dermatology  Encounter for screening for diabetes mellitus -     Hemoglobin A1c  Screening for metabolic disorder -     [id number]  Screening for deficiency anemia -     CBC  Lipid screening -     Lipid panel  Colon cancer screening -     Fecal occult blood, imunochemical([facility]/Sunquest)  Anxiety and depression -     hydrOXYzine (ATARAX/VISTARIL) 10 MG tablet; TAKE 1 TABLET (10 MG TOTAL) BY MOUTH 3 (THREE) TIMES DAILY AS NEEDED FOR ANXIETY.    Patient has been counseled on age-appropriate routine health concerns for screening and prevention. These are reviewed and up-to-date. Referrals have been placed accordingly. Immunizations are up-to-date or declined.    Subjective:   Chief Complaint  Patient presents with  . Nail Problem   HPI Stacie Pratt 45 y.o. female presents to office today for follow up of fingernail fungus.   has a past medical history of Hyperlipidemia.  She has right thumb Tinea manuum. Chronic.  She has been treated twice by me for 6 weeks duration each time once in 10-06-2018 and most recently 01-22-2020. At this time she will need to see dermatology as her nail fungus has not improved. She states she has seen dermatology in the past however her fingernail fungus had improved at that time and she was not treated.    Review of Systems  Constitutional: Negative for fever, malaise/fatigue and weight loss.  HENT: Negative.  Negative for nosebleeds.   Eyes: Negative.  Negative for blurred vision, double vision and photophobia.  Respiratory: Negative.  Negative for cough and shortness of breath.   Cardiovascular: Negative.  Negative for chest pain, palpitations and leg swelling.  Gastrointestinal: Negative.  Negative for heartburn, nausea and vomiting.  Musculoskeletal: Negative.  Negative for myalgias.   Skin:       SEE HPI  Neurological: Negative.  Negative for dizziness, focal weakness, seizures and headaches.  Psychiatric/Behavioral: Negative for suicidal ideas. The patient is nervous/anxious.     Past Medical History:  Diagnosis Date  . Hyperlipidemia     Past Surgical History:  Procedure Laterality Date  . CESAREAN SECTION     3x  . TUBAL LIGATION      Family History  Problem Relation Age of Onset  . Healthy Mother   . Healthy Father   . Diabetes Sister     Social History Reviewed with no changes to be made today.   Outpatient Medications Prior to Visit  Medication Sig Dispense Refill  . ciclopirox (PENLAC) 8 % solution Apply topically at bedtime. Apply over nail and surrounding skin. Apply daily over previous coat. After seven (7) days, may remove with alcohol and continue cycle.    . hydrOXYzine (ATARAX/VISTARIL) 10 MG tablet TAKE 1 TABLET (10 MG TOTAL) BY MOUTH 3 (THREE) TIMES DAILY AS NEEDED FOR ANXIETY. (Patient not taking: Reported on 05/23/2020) 60 tablet 1  . terbinafine (LAMISIL) 250 MG tablet TAKE 1 TABLET BY MOUTH DAILY (Patient not taking: Reported on 08/23/2020) 14 tablet 0  . valACYclovir (VALTREX) 1000 MG tablet TAKE 1 TABLET (1,000 MG TOTAL) BY MOUTH 3 (THREE) TIMES DAILY FOR 7 DAYS. (Patient not taking: Reported on 08/23/2020) 21 tablet 0   No facility-administered medications prior to visit.    No Known Allergies  Objective:    BP 104/71   Pulse 72   Resp 18   Ht '5\' 1"'  (1.549 m)   Wt 138 lb 12.8 oz (63 kg)   SpO2 97%   BMI 26.23 kg/m  Wt Readings from Last 3 Encounters:  08/23/20 138 lb 12.8 oz (63 kg)  05/23/20 140 lb (63.5 kg)  03/13/20 147 lb 3.2 oz (66.8 kg)    Physical Exam Vitals and nursing note reviewed.  Constitutional:      Appearance: She is well-developed.  HENT:     Head: Normocephalic and atraumatic.  Cardiovascular:     Rate and Rhythm: Normal rate and regular rhythm.     Heart sounds: Normal heart sounds. No  murmur heard. No friction rub. No gallop.   Pulmonary:     Effort: Pulmonary effort is normal. No tachypnea or respiratory distress.     Breath sounds: Normal breath sounds. No decreased breath sounds, wheezing, rhonchi or rales.  Chest:     Chest wall: No tenderness.  Abdominal:     General: Bowel sounds are normal.     Palpations: Abdomen is soft.  Musculoskeletal:        General: Normal range of motion.     Right hand: Deformity (right thumbnail) present.     Cervical back: Normal range of motion.  Skin:    General: Skin is warm and dry.  Neurological:     Mental Status: She is alert and oriented to person, place, and time.     Coordination: Coordination normal.  Psychiatric:        Behavior: Behavior normal. Behavior is cooperative.        Thought Content: Thought content normal.        Judgment: Judgment normal.          Patient has been counseled extensively about nutrition and exercise as well as the importance of adherence with medications and regular follow-up. The patient was given clear instructions to go to ER or return to medical center if symptoms don't improve, worsen or new problems develop. The patient verbalized understanding.   Follow-up: Return if symptoms worsen or fail to improve.   Gildardo Pounds, FNP-BC [facility] and [place], [place]   08/23/2020, 2:02 PM

## 2020-08-28 ENCOUNTER — Other Ambulatory Visit: Payer: Self-pay

## 2020-08-28 ENCOUNTER — Ambulatory Visit: Payer: Self-pay | Attending: Nurse Practitioner

## 2020-09-06 ENCOUNTER — Ambulatory Visit: Payer: Self-pay | Attending: Nurse Practitioner

## 2020-09-06 ENCOUNTER — Other Ambulatory Visit: Payer: Self-pay

## 2020-09-07 LAB — HEMOGLOBIN A1C
Est. average glucose Bld gHb Est-mCnc: 117 mg/dL
Hgb A1c MFr Bld: 5.7 % — ABNORMAL HIGH (ref 4.8–5.6)

## 2020-09-07 LAB — CBC
Hematocrit: 39 % (ref 34.0–46.6)
Hemoglobin: 12.9 g/dL (ref 11.1–15.9)
MCH: 29.9 pg (ref 26.6–33.0)
MCHC: 33.1 g/dL (ref 31.5–35.7)
MCV: 91 fL (ref 79–97)
Platelets: 235 10*3/uL (ref 150–450)
RBC: 4.31 x10E6/uL (ref 3.77–5.28)
RDW: 13.2 % (ref 11.7–15.4)
WBC: 4.9 10*3/uL (ref 3.4–10.8)

## 2020-09-07 LAB — LIPID PANEL
Chol/HDL Ratio: 4.7 ratio — ABNORMAL HIGH (ref 0.0–4.4)
Cholesterol, Total: 189 mg/dL (ref 100–199)
HDL: 40 mg/dL (ref >39)
LDL Chol Calc (NIH): 124 mg/dL — ABNORMAL HIGH (ref 0–99)
Triglycerides: 137 mg/dL (ref 0–149)
VLDL Cholesterol Cal: 25 mg/dL (ref 5–40)

## 2020-09-07 LAB — CMP14+EGFR
ALT: 14 IU/L (ref 0–32)
AST: 16 IU/L (ref 0–40)
Albumin/Globulin Ratio: 1.7 (ref 1.2–2.2)
Albumin: 4.2 g/dL (ref 3.8–4.8)
Alkaline Phosphatase: 38 IU/L — ABNORMAL LOW (ref 44–121)
BUN/Creatinine Ratio: 9 (ref 9–23)
BUN: 7 mg/dL (ref 6–24)
Bilirubin Total: 0.3 mg/dL (ref 0.0–1.2)
CO2: 22 mmol/L (ref 20–29)
Calcium: 9 mg/dL (ref 8.7–10.2)
Chloride: 103 mmol/L (ref 96–106)
Creatinine, Ser: 0.77 mg/dL (ref 0.57–1.00)
Globulin, Total: 2.5 g/dL (ref 1.5–4.5)
Glucose: 85 mg/dL (ref 65–99)
Potassium: 4.6 mmol/L (ref 3.5–5.2)
Sodium: 138 mmol/L (ref 134–144)
Total Protein: 6.7 g/dL (ref 6.0–8.5)
eGFR: 96 mL/min/{1.73_m2} (ref >59)

## 2020-10-22 ENCOUNTER — Other Ambulatory Visit: Payer: Self-pay

## 2020-10-22 MED ORDER — TERBINAFINE HCL 250 MG PO TABS
250.0000 mg | ORAL_TABLET | Freq: Every day | ORAL | 0 refills | Status: DC
  Filled 2020-10-22: qty 30, 30d supply, fill #0

## 2020-12-11 ENCOUNTER — Other Ambulatory Visit: Payer: Self-pay

## 2020-12-11 MED ORDER — AMOXICILLIN 500 MG PO CAPS
ORAL_CAPSULE | ORAL | 0 refills | Status: DC
  Filled 2020-12-11: qty 25, 6d supply, fill #0

## 2020-12-18 ENCOUNTER — Other Ambulatory Visit: Payer: Self-pay

## 2020-12-18 MED ORDER — ITRACONAZOLE 100 MG PO CAPS
ORAL_CAPSULE | ORAL | 1 refills | Status: DC
  Filled 2020-12-18: qty 60, 30d supply, fill #0
  Filled 2021-02-06: qty 60, 30d supply, fill #1

## 2020-12-19 ENCOUNTER — Other Ambulatory Visit: Payer: Self-pay

## 2020-12-23 ENCOUNTER — Other Ambulatory Visit: Payer: Self-pay

## 2021-02-06 ENCOUNTER — Other Ambulatory Visit: Payer: Self-pay

## 2021-02-07 ENCOUNTER — Other Ambulatory Visit: Payer: Self-pay

## 2021-02-10 ENCOUNTER — Other Ambulatory Visit: Payer: Self-pay

## 2021-02-28 ENCOUNTER — Telehealth (INDEPENDENT_AMBULATORY_CARE_PROVIDER_SITE_OTHER): Payer: Self-pay

## 2021-02-28 NOTE — Telephone Encounter (Signed)
Copied from CRM [id number]. Topic: General - Other >> Feb 24, 2021 10:39 AM Gwenlyn Fudge wrote: Reason for CRM: Pt called and is requesting to speak with Arna Medici in regards to her dermatology appt. Please advise.

## 2021-03-04 ENCOUNTER — Ambulatory Visit: Payer: No Typology Code available for payment source

## 2021-03-10 ENCOUNTER — Other Ambulatory Visit: Payer: Self-pay

## 2021-03-10 MED ORDER — CICLOPIROX OLAMINE 0.77 % EX CREA
TOPICAL_CREAM | CUTANEOUS | 0 refills | Status: DC
  Filled 2021-03-10: qty 30, 15d supply, fill #0

## 2021-03-31 ENCOUNTER — Ambulatory Visit: Payer: No Typology Code available for payment source | Admitting: Nurse Practitioner

## 2021-03-31 ENCOUNTER — Other Ambulatory Visit: Payer: Self-pay | Admitting: Nurse Practitioner

## 2021-03-31 DIAGNOSIS — R7303 Prediabetes: Secondary | ICD-10-CM

## 2021-04-08 ENCOUNTER — Ambulatory Visit: Payer: Self-pay

## 2021-04-08 ENCOUNTER — Ambulatory Visit: Payer: Self-pay | Attending: Nurse Practitioner

## 2021-04-08 ENCOUNTER — Other Ambulatory Visit: Payer: Self-pay

## 2021-04-08 DIAGNOSIS — R7303 Prediabetes: Secondary | ICD-10-CM

## 2021-04-09 LAB — CMP14+EGFR
ALT: 10 IU/L (ref 0–32)
AST: 18 IU/L (ref 0–40)
Albumin/Globulin Ratio: 1.7 (ref 1.2–2.2)
Albumin: 4.5 g/dL (ref 3.8–4.8)
Alkaline Phosphatase: 36 IU/L — ABNORMAL LOW (ref 44–121)
BUN/Creatinine Ratio: 17 (ref 9–23)
BUN: 12 mg/dL (ref 6–24)
Bilirubin Total: 0.4 mg/dL (ref 0.0–1.2)
CO2: 21 mmol/L (ref 20–29)
Calcium: 9.1 mg/dL (ref 8.7–10.2)
Chloride: 102 mmol/L (ref 96–106)
Creatinine, Ser: 0.71 mg/dL (ref 0.57–1.00)
Globulin, Total: 2.6 g/dL (ref 1.5–4.5)
Glucose: 93 mg/dL (ref 70–99)
Potassium: 4.4 mmol/L (ref 3.5–5.2)
Sodium: 137 mmol/L (ref 134–144)
Total Protein: 7.1 g/dL (ref 6.0–8.5)
eGFR: 105 mL/min/{1.73_m2} (ref >59)

## 2021-04-09 LAB — HEMOGLOBIN A1C
Est. average glucose Bld gHb Est-mCnc: 114 mg/dL
Hgb A1c MFr Bld: 5.6 % (ref 4.8–5.6)

## 2021-04-14 ENCOUNTER — Telehealth: Payer: Self-pay

## 2021-04-14 NOTE — Telephone Encounter (Signed)
Called patient reviewed all information and repeated back to me. Will call if any questions.  Translator [place] [phone]

## 2021-04-14 NOTE — Telephone Encounter (Signed)
-----   Message from Claiborne Rigg, NP sent at 04/11/2021  9:17 PM EST ----- A1c normal at this time. Kidney, liver function and electrolytes are normal.

## 2021-04-17 ENCOUNTER — Other Ambulatory Visit: Payer: Self-pay

## 2021-04-17 ENCOUNTER — Ambulatory Visit: Payer: Self-pay | Attending: Nurse Practitioner | Admitting: Physician Assistant

## 2021-04-17 VITALS — BP 96/62 | HR 64 | Ht 61.0 in | Wt 128.6 lb

## 2021-04-17 DIAGNOSIS — L299 Pruritus, unspecified: Secondary | ICD-10-CM

## 2021-04-17 DIAGNOSIS — Z789 Other specified health status: Secondary | ICD-10-CM

## 2021-04-17 DIAGNOSIS — R7303 Prediabetes: Secondary | ICD-10-CM

## 2021-04-17 DIAGNOSIS — H00015 Hordeolum externum left lower eyelid: Secondary | ICD-10-CM

## 2021-04-17 MED ORDER — ERYTHROMYCIN 5 MG/GM OP OINT
1.0000 "application " | TOPICAL_OINTMENT | Freq: Every day | OPHTHALMIC | 0 refills | Status: DC
  Filled 2021-04-17: qty 3.5, 3d supply, fill #0

## 2021-04-17 MED ORDER — TRIAMCINOLONE ACETONIDE 0.1 % EX CREA
1.0000 "application " | TOPICAL_CREAM | Freq: Two times a day (BID) | CUTANEOUS | 1 refills | Status: DC
  Filled 2021-04-17: qty 45, 23d supply, fill #0

## 2021-04-17 NOTE — Patient Instructions (Addendum)
Orzuelo Stye Un orzuelo, tambin conocido como hordeolum, es una protuberancia que se forma en un prpado. Puede parecer un grano junto a la pestaa. Puede formarse dentro del prpado (orzuelo interno) o fuera del prpado (orzuelo externo). Un orzuelo puede causar enrojecimiento, hinchazn y dolor en el prpado. Los orzuelos son muy frecuentes. Todas las personas pueden tener orzuelos a cualquier edad. Suelen ocurrir solo en un ojo, pero puede tener ms de uno en cualquiera de los dos ojos. Cules son las causas? La causa de un orzuelo es una infeccin. La infeccin casi siempre es causada por una bacteria llamada Staphylococcus aureus. Este es un tipo comn de bacteria que vive en la piel. Un orzuelo interno puede ser causado por una infeccin en una glndula sebcea dentro del prpado. Un orzuelo externo puede ser causado por una infeccin en la base de la pestaa (folculo piloso). Qu incrementa el riesgo? Una persona es ms propensa a tener un orzuelo en los siguientes casos: Si tuvo un orzuelo antes. Si tiene alguna de estas afecciones: Enrojecimiento, picazn e inflamacin en los prpados (blefaritis). Una afeccin de la piel llamada dermatitis seborreica o roscea. Niveles altos de grasa en la sangre (lpidos). Ojos secos. Cules son los signos o sntomas? El sntoma ms frecuente del orzuelo es el dolor en el prpado. Los orzuelos internos son ms dolorosos que los externos. Otros sntomas pueden incluir lo siguiente: Hinchazn dolorosa del prpado. Sensacin de picazn en el ojo. Lagrimeo y enrojecimiento del ojo. Protuberancia similar a un grano en el borde del prpado. Pus que drena del orzuelo. Cmo se diagnostica? Con tan solo examinarle el ojo, el mdico puede diagnosticarle un orzuelo. Tambin puede revisarlo para asegurarse de que: No tenga fiebre ni otros signos de una infeccin ms grave. La infeccin no se haya diseminado a otras partes del ojo o a zonas  circundantes. Cmo se trata? En la mayora de los casos, el orzuelo desaparece en el transcurso de unos das sin tratamiento o con la aplicacin de compresas tibias. Es posible que sea necesario usar gotas o ungento con antibitico para tratar la infeccin. A veces, se usan gotas o ungentos con corticoesteroides adems de antibiticos. En algunos casos, su mdico podr administrarle una pequea inyeccin de corticoesteroides en el prpado. Si el orzuelo no se cura con el tratamiento de rutina, el mdico puede drenarle el pus del orzuelo con un bistur de hoja fina o una aguja. Esto puede hacerse si el orzuelo es grande, ocasiona mucho dolor o afecta la vista. Siga estas instrucciones en su casa: Use los medicamentos de venta libre y los recetados solamente como se lo haya indicado el mdico. Estos incluyen gotas o ungentos para los ojos. Si le recetaron un antibitico, medicamentos con corticoesteroides o ambos, aplquese o use los medicamentos como se lo haya indicado el mdico. No deje de usar el medicamento aunque la afeccin mejore. Aplique un pao hmedo y tibio (compresa tibia) sobre el ojo durante 5 a 10 minutos, 4 a 6 veces al da. Limpie el prpado afectado como se lo haya indicado el mdico. No use lentes de contacto ni maquillaje para los ojos hasta que el orzuelo se haya curado y su profesional de la salud le diga que puede hacerlo. No trate de reventar o drenar el orzuelo. No se frote el ojo. Comunquese con un mdico si: Siente escalofros o tiene fiebre. El orzuelo no desaparece despus de varios das. El orzuelo afecta la visin. Comienza a sentir dolor en el globo ocular, o se   le hincha o enrojece. Solicite ayuda de inmediato si: Siente dolor al mover el ojo. Resumen Un orzuelo es una protuberancia que se forma en un prpado. Puede parecer un grano junto a la pestaa. Puede formarse dentro del prpado (orzuelo interno) o fuera del prpado (orzuelo externo). Un orzuelo puede  causar enrojecimiento, hinchazn y dolor en el prpado. Con tan solo examinarle el ojo, el mdico puede diagnosticarle un orzuelo. Aplique un pao hmedo y tibio (compresa tibia) sobre el ojo durante 5 a 10 minutos, 4 a 6 veces al da. Esta informacin no tiene como fin reemplazar el consejo del mdico. Asegrese de hacerle al mdico cualquier pregunta que tenga. Document Revised: 07/16/2020 Document Reviewed: 07/16/2020 Elsevier Patient Education  2022 [employer]  

## 2021-04-17 NOTE — Progress Notes (Signed)
Patient ID: Stacie Pratt, female   DOB: 1975/09/19, 45 y.o.   MRN: [id number]   Stacie Pratt, is a 45 y.o. female  [id number]  [id number]  DOB - March 10, 1976  Chief Complaint  Patient presents with   Prediabetes       Subjective:   Stacie Pratt is a 45 y.o. female here today for stye in L eye.  Itching and burning.  She has had it for weeks now and it isn't going away.  No vision change.  No discharge.  Upper back is itching and has dry skin.  No new soaps or detergents.  No fever.    Wants to discuss A1C from earlier this month No problems updated.  ALLERGIES: No Known Allergies  PAST MEDICAL HISTORY: Past Medical History:  Diagnosis Date   Hyperlipidemia     MEDICATIONS AT HOME: Prior to Admission medications   Medication Sig Start Date End Date Taking? Authorizing Provider  ciclopirox (PENLAC) 8 % solution Apply topically at bedtime. Apply over nail and surrounding skin. Apply daily over previous coat. After seven (7) days, may remove with alcohol and continue cycle.   Yes [provider]  erythromycin ophthalmic ointment Place 1 application into the left eye at bedtime. 04/17/21  Yes Georgian Co M, PA-C  triamcinolone cream (KENALOG) 0.1 % Apply 1 application topically 2 (two) times daily. Prn itching 04/17/21  Yes Georgian Co M, PA-C  amoxicillin (AMOXIL) 500 MG capsule Take 2 capsules by mouth now then 1 capsule by mouth every 6 hours until gone Patient not taking: Reported on 04/17/2021 12/11/20     ciclopirox (LOPROX) 0.77 % cream Apply 1 a small amount to affected area twice a day Patient not taking: Reported on 04/17/2021 03/10/21     hydrOXYzine (ATARAX/VISTARIL) 10 MG tablet TAKE 1 TABLET (10 MG TOTAL) BY MOUTH 3 (THREE) TIMES DAILY AS NEEDED FOR ANXIETY. Patient not taking: Reported on 04/17/2021 08/23/20 08/23/21  Claiborne Rigg, NP  itraconazole (SPORANOX) 100 MG capsule Take 2 capsules by mouth daily. Patient not  taking: Reported on 04/17/2021 12/18/20     terbinafine (LAMISIL) 250 MG tablet Take 1 tablet by mouth daily Patient not taking: Reported on 04/17/2021 10/22/20       ROS: Neg resp Neg cardiac Neg GI Neg GU Neg MS Neg psych Neg neuro  Objective:   Vitals:   04/17/21 0846  BP: 96/62  Pulse: 64  SpO2: 99%  Weight: 128 lb 9.6 oz (58.3 kg)  Height: 5\' 1"  (1.549 m)   Exam General appearance : Awake, alert, not in any distress. Speech Clear. Not toxic looking HEENT: Atraumatic and Normocephalic, pupils equally reactive to light and accomodation, large stye L lower lid ?may be forming a chalazion.  B conjunctivae WNL.  Fundi benign Neck: Supple, no JVD. No cervical lymphadenopathy.  Chest: Good air entry bilaterally, CTAB.  No rales/rhonchi/wheezing CVS: S1 S2 regular, no murmurs.  Extremities: B/L Lower Ext shows no edema, both legs are warm to touch Neurology: Awake alert, and oriented X 3, CN II-XII intact, Non focal Skin:Dry skin on upper back.  Not a herald patch  Data Review Lab Results  Component Value Date   HGBA1C 5.6 04/08/2021   HGBA1C 5.7 (H) 09/06/2020   HGBA1C 5.6 08/29/2019    Assessment & Plan   1. Prediabetes Controlled by diet I have had a lengthy discussion and provided education about insulin resistance and the intake of too much sugar/refined carbohydrates.  I have advised the patient to  work at a goal of eliminating sugary drinks, candy, desserts, sweets, refined sugars, processed foods, and white carbohydrates.  The patient expresses understanding.    2. Hordeolum externum of left lower eyelid - Ambulatory referral to Ophthalmology - erythromycin ophthalmic ointment; Place 1 application into the left eye at bedtime.  Dispense: 3.5 g; Refill: 0  3. Itching - triamcinolone cream (KENALOG) 0.1 %; Apply 1 application topically 2 (two) times daily. Prn itching  Dispense: 45 g; Refill: 1  4. Language barrier-[employer] interpreters (Joe)used and additional  time performing visit was required.    Patient have been counseled extensively about nutrition and exercise. Other issues discussed during this visit include: low cholesterol diet, weight control and daily exercise, foot care, annual eye examinations at Ophthalmology, importance of adherence with medications and regular follow-up. We also discussed long term complications of uncontrolled diabetes and hypertension.   Return in about 4 months (around 08/16/2021) for PCP for chronic conditions.  The patient was given clear instructions to go to ER or return to medical center if symptoms don't improve, worsen or new problems develop. The patient verbalized understanding. The patient was told to call to get lab results if they haven't heard anything in the next week.      Georgian Co, PA-C [facility] [place], [place] [phone]   04/17/2021, 9:07 AM

## 2021-04-17 NOTE — Progress Notes (Signed)
Left eye is not getting better. Itching on upper and middle back.

## 2021-04-22 ENCOUNTER — Ambulatory Visit: Payer: Self-pay | Attending: Nurse Practitioner

## 2021-04-22 ENCOUNTER — Other Ambulatory Visit: Payer: Self-pay

## 2021-04-22 DIAGNOSIS — Z23 Encounter for immunization: Secondary | ICD-10-CM

## 2021-08-15 ENCOUNTER — Other Ambulatory Visit (HOSPITAL_COMMUNITY)
Admission: RE | Admit: 2021-08-15 | Discharge: 2021-08-15 | Disposition: A | Discharge status: Home / Self Care | Payer: No Typology Code available for payment source | Source: Ambulatory Visit | Attending: Nurse Practitioner | Admitting: Nurse Practitioner

## 2021-08-15 ENCOUNTER — Ambulatory Visit: Payer: Self-pay | Attending: Nurse Practitioner | Admitting: Nurse Practitioner

## 2021-08-15 ENCOUNTER — Other Ambulatory Visit: Payer: Self-pay

## 2021-08-15 ENCOUNTER — Encounter: Payer: Self-pay | Admitting: Nurse Practitioner

## 2021-08-15 VITALS — BP 109/75 | HR 60 | Wt 137.6 lb

## 2021-08-15 DIAGNOSIS — N760 Acute vaginitis: Secondary | ICD-10-CM | POA: Insufficient documentation

## 2021-08-15 DIAGNOSIS — Z1231 Encounter for screening mammogram for malignant neoplasm of breast: Secondary | ICD-10-CM

## 2021-08-15 DIAGNOSIS — R7303 Prediabetes: Secondary | ICD-10-CM

## 2021-08-15 DIAGNOSIS — H00015 Hordeolum externum left lower eyelid: Secondary | ICD-10-CM

## 2021-08-15 DIAGNOSIS — Z1211 Encounter for screening for malignant neoplasm of colon: Secondary | ICD-10-CM

## 2021-08-15 DIAGNOSIS — F419 Anxiety disorder, unspecified: Secondary | ICD-10-CM

## 2021-08-15 DIAGNOSIS — F32A Depression, unspecified: Secondary | ICD-10-CM

## 2021-08-15 MED ORDER — HYDROXYZINE HCL 10 MG PO TABS
ORAL_TABLET | ORAL | 1 refills | Status: DC
  Filled 2021-08-15: qty 60, 20d supply, fill #0

## 2021-08-15 NOTE — Progress Notes (Signed)
? ?Assessment & Plan:  ?Everything for him [place] was seen today for prediabetes, eye problem and vaginal itching. ? ?Diagnoses and all orders for this visit: ? ?Prediabetes ?-     Hemoglobin A1c ? ?Breast cancer screening by mammogram ?-     MS DIGITAL SCREENING TOMO BILATERAL; Future ? ?Colon cancer screening ?-     Fecal occult blood, imunochemical([facility]) ? ?Acute vaginitis ?-     Cervicovaginal ancillary only self swab ? ?Anxiety and depression ?Follow-up in a few weeks.  May need to switch to SSRI if side effects with hydroxyzine. ?-     hydrOXYzine (ATARAX) 10 MG tablet; TAKE 1 TABLET (10 MG TOTAL) BY MOUTH 3 (THREE) TIMES DAILY AS NEEDED FOR ANXIETY. ? ?Hordeolum externum of left lower eyelid ?Refer to ophthalmology ? ? ?Patient has been counseled on age-appropriate routine health concerns for screening and prevention. These are reviewed and up-to-date. Referrals have been placed accordingly. Immunizations are up-to-date or declined.    ?Subjective:  ? ?Chief Complaint  ?Patient presents with  ? Prediabetes  ? Eye Problem  ? Vaginal Itching  ? ?HPI ?Stacie Pratt 46 y.o. female presents to office today for follow up to prediabetes and left eye stye.  ? ?She has a stye on the left lower eyelid. It has been present for 1 year.  No improvement despite frequent warm compresses.  Denies any visual deficits related to the stye ? ?Vaginitis ?Endorses intermittent vaginal irritation and itching.  Denies any malodorous discharge, dysuria, flank pain or malodorous urine. ? ?Anxiety ?She would like a refill of  hydroxyzine. She never started it when it was initially prescribed last year.  She has taken BuSpar in the past and did not like the side effects that she stopped taking this.  She notes blurred vision associated with increased irritability and anxiety. She has not had a comprehensive eye exam.  ? ?Prediabetes ?Well-controlled with diet only at this time. ?Lab Results  ?Component Value Date  ?  HGBA1C 5.6 04/08/2021  ?  ?Review of Systems  ?Constitutional:  Negative for fever, malaise/fatigue and weight loss.  ?HENT: Negative.  Negative for nosebleeds.   ?Eyes: Negative.  Negative for blurred vision, double vision and photophobia.  ?     SEE HPI  ?Respiratory: Negative.  Negative for cough and shortness of breath.   ?Cardiovascular: Negative.  Negative for chest pain, palpitations and leg swelling.  ?Gastrointestinal: Negative.  Negative for heartburn, nausea and vomiting.  ?Genitourinary:   ?     See hpi  ?Musculoskeletal: Negative.  Negative for myalgias.  ?Neurological: Negative.  Negative for dizziness, focal weakness, seizures and headaches.  ?Psychiatric/Behavioral:  Positive for depression. Negative for suicidal ideas. The patient is nervous/anxious.   ? ?Past Medical History:  ?Diagnosis Date  ? Hyperlipidemia   ? ? ?Past Surgical History:  ?Procedure Laterality Date  ? CESAREAN SECTION    ? 3x  ? TUBAL LIGATION    ? ? ?Family History  ?Problem Relation Age of Onset  ? Healthy Mother   ? Healthy Father   ? Diabetes Sister   ? ? ?Social History Reviewed with no changes to be made today.  ? ?Outpatient Medications Prior to Visit  ?Medication Sig Dispense Refill  ? amoxicillin (AMOXIL) 500 MG capsule Take 2 capsules by mouth now then 1 capsule by mouth every 6 hours until gone (Patient not taking: Reported on 08/15/2021) 25 capsule 0  ? ciclopirox (LOPROX) 0.77 % cream Apply 1 a small amount  to affected area twice a day (Patient not taking: Reported on 08/15/2021) 30 g 0  ? ciclopirox (PENLAC) 8 % solution Apply topically at bedtime. Apply over nail and surrounding skin. Apply daily over previous coat. After seven (7) days, may remove with alcohol and continue cycle. (Patient not taking: Reported on 08/15/2021)    ? erythromycin ophthalmic ointment Place 1 application into the left eye at bedtime. (Patient not taking: Reported on 08/15/2021) 3.5 g 0  ? hydrOXYzine (ATARAX/VISTARIL) 10 MG tablet TAKE 1  TABLET (10 MG TOTAL) BY MOUTH 3 (THREE) TIMES DAILY AS NEEDED FOR ANXIETY. (Patient not taking: Reported on 08/15/2021) 60 tablet 1  ? itraconazole (SPORANOX) 100 MG capsule Take 2 capsules by mouth daily. (Patient not taking: Reported on 08/15/2021) 60 capsule 1  ? terbinafine (LAMISIL) 250 MG tablet Take 1 tablet by mouth daily (Patient not taking: Reported on 08/15/2021) 45 tablet 0  ? triamcinolone cream (KENALOG) 0.1 % Apply 1 application topically 2 (two) times daily. Prn itching (Patient not taking: Reported on 08/15/2021) 45 g 1  ? ?No facility-administered medications prior to visit.  ? ? ?No Known Allergies ? ?   ?Objective:  ?  ?BP 109/75   Pulse 60   Wt 137 lb 9.6 oz (62.4 kg)   SpO2 98%   BMI 26.00 kg/m?  ?Wt Readings from Last 3 Encounters:  ?08/15/21 137 lb 9.6 oz (62.4 kg)  ?04/17/21 128 lb 9.6 oz (58.3 kg)  ?08/23/20 138 lb 12.8 oz (63 kg)  ? ? ?Physical Exam ?Vitals and nursing note reviewed.  ?Constitutional:   ?   Appearance: She is well-developed.  ?HENT:  ?   Head: Normocephalic and atraumatic.  ?Eyes:  ? ?Cardiovascular:  ?   Rate and Rhythm: Normal rate and regular rhythm.  ?   Heart sounds: Normal heart sounds. No murmur heard. ?  No friction rub. No gallop.  ?Pulmonary:  ?   Effort: Pulmonary effort is normal. No tachypnea or respiratory distress.  ?   Breath sounds: Normal breath sounds. No decreased breath sounds, wheezing, rhonchi or rales.  ?Chest:  ?   Chest wall: No tenderness.  ?Abdominal:  ?   General: Bowel sounds are normal.  ?   Palpations: Abdomen is soft.  ?Musculoskeletal:     ?   General: Normal range of motion.  ?   Cervical back: Normal range of motion.  ?Skin: ?   General: Skin is warm and dry.  ?Neurological:  ?   Mental Status: She is alert and oriented to person, place, and time.  ?   Coordination: Coordination normal.  ?Psychiatric:     ?   Behavior: Behavior normal. Behavior is cooperative.     ?   Thought Content: Thought content normal.     ?   Judgment: Judgment  normal.  ? ? ? ? ?   ?Patient has been counseled extensively about nutrition and exercise as well as the importance of adherence with medications and regular follow-up. The patient was given clear instructions to go to ER or return to medical center if symptoms don't improve, worsen or new problems develop. The patient verbalized understanding.  ? ?Follow-up: Return in about 3 weeks (around 09/05/2021) for virtual on tuesday for anxiety. .  ? ?Claiborne Rigg, FNP-BC ?[facility] ?[place], [place] ?[phone]   ?08/15/2021, 11:06 AM ?

## 2021-08-16 LAB — HEMOGLOBIN A1C
Est. average glucose Bld gHb Est-mCnc: 117 mg/dL
Hgb A1c MFr Bld: 5.7 % — ABNORMAL HIGH (ref 4.8–5.6)

## 2021-08-18 ENCOUNTER — Other Ambulatory Visit: Payer: Self-pay

## 2021-08-18 ENCOUNTER — Other Ambulatory Visit: Payer: Self-pay | Admitting: Nurse Practitioner

## 2021-08-18 DIAGNOSIS — B9689 Other specified bacterial agents as the cause of diseases classified elsewhere: Secondary | ICD-10-CM

## 2021-08-18 LAB — CERVICOVAGINAL ANCILLARY ONLY
Bacterial Vaginitis (gardnerella): POSITIVE — AB
Candida Glabrata: NEGATIVE
Candida Vaginitis: NEGATIVE
Chlamydia: NEGATIVE
Comment: NEGATIVE
Comment: NEGATIVE
Comment: NEGATIVE
Comment: NEGATIVE
Comment: NEGATIVE
Comment: NORMAL
Neisseria Gonorrhea: NEGATIVE
Trichomonas: NEGATIVE

## 2021-08-18 MED ORDER — METRONIDAZOLE 500 MG PO TABS
500.0000 mg | ORAL_TABLET | Freq: Two times a day (BID) | ORAL | 0 refills | Status: AC
  Filled 2021-08-18: qty 14, 7d supply, fill #0

## 2021-08-21 ENCOUNTER — Other Ambulatory Visit: Payer: Self-pay

## 2021-09-09 ENCOUNTER — Ambulatory Visit (HOSPITAL_BASED_OUTPATIENT_CLINIC_OR_DEPARTMENT_OTHER): Payer: No Typology Code available for payment source | Admitting: Nurse Practitioner

## 2021-09-09 ENCOUNTER — Encounter: Payer: Self-pay | Admitting: Nurse Practitioner

## 2021-09-09 ENCOUNTER — Other Ambulatory Visit: Payer: Self-pay

## 2021-09-09 DIAGNOSIS — N760 Acute vaginitis: Secondary | ICD-10-CM

## 2021-09-09 DIAGNOSIS — F419 Anxiety disorder, unspecified: Secondary | ICD-10-CM

## 2021-09-09 MED ORDER — HYDROXYZINE HCL 25 MG PO TABS
25.0000 mg | ORAL_TABLET | Freq: Three times a day (TID) | ORAL | 1 refills | Status: DC | PRN
  Filled 2021-09-09: qty 60, 20d supply, fill #0

## 2021-09-09 NOTE — Progress Notes (Signed)
Virtual Visit via Telephone Note ? I discussed the limitations, risks, security and privacy concerns of performing an evaluation and management service by telephone and the availability of in person appointments. I also discussed with the patient that there may be a patient responsible charge related to this service. The patient expressed understanding and agreed to proceed.  ? ? ?I connected with Stacie Pratt on 09/09/21  at   2:50 PM EDT  EDT by telephone and verified that I am speaking with the correct person using two identifiers. ? ?Location of Patient: ?Private Residence ?  ?Location of Provider: ?Scientist, research (physical sciences) and [employer] Office  ?  ?Persons participating in Telemedicine visit: ?Geryl Rankins FNP-BC ?Stacie Pratt  ?  ?History of Present Illness: ?Telemedicine visit for: Anxiety ? ?She was started on hydroxyzine 10 mg TID prn. She has not noticed much improvement in her "stress" with her children. I have instructed her that this medication is not to help relieve stress but to help with her anxiousness. She would like to try to increase her hydroxyzine at this time. ? ?Vaginitis ?She was treated for BV last month with flagyl. She now endorses vaginal irritation. She denies pelvic pain, dysuria, flank pain, odor or increased vaginal discharge.  ? ? ?Past Medical History:  ?Diagnosis Date  ? Hyperlipidemia   ?  ?Past Surgical History:  ?Procedure Laterality Date  ? CESAREAN SECTION    ? 3x  ? TUBAL LIGATION    ?  ?Family History  ?Problem Relation Age of Onset  ? Healthy Mother   ? Healthy Father   ? Diabetes Sister   ?  ?Social History  ? ?Socioeconomic History  ? Marital status: Single  ?  Spouse name: Not on file  ? Number of children: 3  ? Years of education: Not on file  ? Highest education level: 6th grade  ?Occupational History  ? Not on file  ?Tobacco Use  ? Smoking status: Never  ? Smokeless tobacco: Never  ?Vaping Use  ? Vaping Use: Never used  ?Substance and Sexual Activity   ? Alcohol use: Never  ? Drug use: Never  ? Sexual activity: Yes  ?  Birth control/protection: Surgical  ?Other Topics Concern  ? Not on file  ?Social History Narrative  ? Not on file  ? ?Social Determinants of Health  ? ?Financial Resource Strain: Not on file  ?Food Insecurity: Not on file  ?Transportation Needs: Not on file  ?Physical Activity: Not on file  ?Stress: Not on file  ?Social Connections: Not on file  ?  ? ?Observations/Objective: ?Awake, alert and oriented x 3 ? ? ?Review of Systems  ?Constitutional:  Negative for fever, malaise/fatigue and weight loss.  ?HENT: Negative.  Negative for nosebleeds.   ?Eyes: Negative.  Negative for blurred vision, double vision and photophobia.  ?Respiratory: Negative.  Negative for cough and shortness of breath.   ?Cardiovascular: Negative.  Negative for chest pain, palpitations and leg swelling.  ?Gastrointestinal: Negative.  Negative for heartburn, nausea and vomiting.  ?Genitourinary:   ?     SEE HPI  ?Musculoskeletal: Negative.  Negative for myalgias.  ?Neurological: Negative.  Negative for dizziness, focal weakness, seizures and headaches.  ?Psychiatric/Behavioral:  Negative for suicidal ideas. The patient is nervous/anxious.    ?Assessment and Plan: ?Diagnoses and all orders for this visit: ? ?Anxiety ?-     hydrOXYzine (ATARAX) 25 MG tablet; Take 1 tablet (25 mg total) by mouth 3 (three) times daily as needed. ? ?Acute vaginitis ?-  Cervicovaginal ancillary only; Future ? ?  ? ?Follow Up Instructions ?Return if symptoms worsen or fail to improve.  ? ?  ?I discussed the assessment and treatment plan with the patient. The patient was provided an opportunity to ask questions and all were answered. The patient agreed with the plan and demonstrated an understanding of the instructions. ?  ?The patient was advised to call back or seek an in-person evaluation if the symptoms worsen or if the condition fails to improve as anticipated. ? ?I provided 11 minutes of  non-face-to-face time during this encounter including median intraservice time, reviewing previous notes, labs, imaging, medications and explaining diagnosis and management. ? ?Gildardo Pounds, FNP-BC  ?

## 2021-09-10 ENCOUNTER — Other Ambulatory Visit: Payer: Self-pay

## 2021-09-10 ENCOUNTER — Ambulatory Visit: Payer: Self-pay | Attending: Nurse Practitioner

## 2021-09-11 ENCOUNTER — Telehealth: Payer: Self-pay | Admitting: Nurse Practitioner

## 2021-09-11 NOTE — Telephone Encounter (Signed)
Copied from CRM [id number]. Topic: General - Other >> Sep 11, 2021  8:47 AM Gaetana Michaelis A wrote: Reason for CRM: Gennine with [facility] has called to request that the patient's recently submitted sample be moved from Future orders to collect so that it may be assessed   Please contact further if needed

## 2021-09-12 ENCOUNTER — Other Ambulatory Visit: Payer: Self-pay | Admitting: *Deleted

## 2021-09-12 ENCOUNTER — Other Ambulatory Visit: Payer: Self-pay

## 2021-09-12 ENCOUNTER — Other Ambulatory Visit (HOSPITAL_COMMUNITY)
Admission: RE | Admit: 2021-09-12 | Discharge: 2021-09-12 | Disposition: A | Discharge status: Home / Self Care | Payer: Self-pay | Source: Ambulatory Visit | Attending: Nurse Practitioner | Admitting: Nurse Practitioner

## 2021-09-12 DIAGNOSIS — N760 Acute vaginitis: Secondary | ICD-10-CM | POA: Insufficient documentation

## 2021-09-12 NOTE — Telephone Encounter (Signed)
Order was released.  

## 2021-09-15 ENCOUNTER — Other Ambulatory Visit: Payer: Self-pay | Admitting: Nurse Practitioner

## 2021-09-15 LAB — CERVICOVAGINAL ANCILLARY ONLY
Bacterial Vaginitis (gardnerella): POSITIVE — AB
Candida Glabrata: NEGATIVE
Candida Vaginitis: NEGATIVE
Chlamydia: NEGATIVE
Comment: NEGATIVE
Comment: NEGATIVE
Comment: NEGATIVE
Comment: NEGATIVE
Comment: NEGATIVE
Comment: NORMAL
Neisseria Gonorrhea: NEGATIVE
Trichomonas: NEGATIVE

## 2021-09-15 MED ORDER — METRONIDAZOLE 500 MG PO TABS
500.0000 mg | ORAL_TABLET | Freq: Two times a day (BID) | ORAL | 0 refills | Status: AC
  Filled 2021-09-15: qty 14, 7d supply, fill #0

## 2021-09-16 ENCOUNTER — Other Ambulatory Visit: Payer: Self-pay

## 2021-09-17 ENCOUNTER — Other Ambulatory Visit: Payer: Self-pay

## 2021-09-21 LAB — FECAL OCCULT BLOOD, IMMUNOCHEMICAL: Fecal Occult Bld: NEGATIVE

## 2021-09-25 ENCOUNTER — Ambulatory Visit
Admission: RE | Admit: 2021-09-25 | Discharge: 2021-09-25 | Disposition: A | Discharge status: Home / Self Care | Payer: No Typology Code available for payment source | Source: Ambulatory Visit | Attending: Nurse Practitioner | Admitting: Nurse Practitioner

## 2021-09-25 DIAGNOSIS — Z1231 Encounter for screening mammogram for malignant neoplasm of breast: Secondary | ICD-10-CM

## 2021-10-10 ENCOUNTER — Other Ambulatory Visit: Payer: Self-pay

## 2021-10-10 ENCOUNTER — Encounter: Payer: Self-pay | Admitting: Nurse Practitioner

## 2021-10-10 ENCOUNTER — Ambulatory Visit: Payer: Self-pay | Attending: Nurse Practitioner | Admitting: Nurse Practitioner

## 2021-10-10 VITALS — BP 101/68 | HR 60 | Wt 140.4 lb

## 2021-10-10 DIAGNOSIS — F32A Depression, unspecified: Secondary | ICD-10-CM

## 2021-10-10 DIAGNOSIS — R6889 Other general symptoms and signs: Secondary | ICD-10-CM

## 2021-10-10 DIAGNOSIS — F419 Anxiety disorder, unspecified: Secondary | ICD-10-CM

## 2021-10-10 DIAGNOSIS — K5909 Other constipation: Secondary | ICD-10-CM

## 2021-10-10 DIAGNOSIS — E785 Hyperlipidemia, unspecified: Secondary | ICD-10-CM

## 2021-10-10 DIAGNOSIS — R200 Anesthesia of skin: Secondary | ICD-10-CM

## 2021-10-10 MED ORDER — SENNA-DOCUSATE SODIUM 8.6-50 MG PO TABS
2.0000 | ORAL_TABLET | Freq: Every day | ORAL | 6 refills | Status: DC
  Filled 2021-10-10: qty 60, 30d supply, fill #0

## 2021-10-10 MED ORDER — DICLOFENAC SODIUM 1 % EX GEL
2.0000 g | Freq: Four times a day (QID) | CUTANEOUS | 1 refills | Status: AC
  Filled 2021-10-10: qty 200, 25d supply, fill #0

## 2021-10-10 NOTE — Progress Notes (Signed)
Assessment & Plan:  Stacie Pratt was seen today for foot swelling.  Diagnoses and all orders for this visit:  Left leg numbness -     diclofenac Sodium (VOLTAREN) 1 % GEL; Apply 2 grams topically to the affected area(s) 4 (four) times daily.  Chronic constipation -     sennosides-docusate sodium (SENOKOT-S) 8.6-50 MG tablet; Take 2 tablets by mouth once daily.  Anxiety and depression -     Ambulatory referral to Integrated Behavioral Health  Dyslipidemia, goal LDL below 100 -     Lipid panel  Abnormal body temperature -     Basic metabolic panel -     Thyroid Panel With TSH    Patient has been counseled on age-appropriate routine health concerns for screening and prevention. These are reviewed and up-to-date. Referrals have been placed accordingly. Immunizations are up-to-date or declined.    Subjective:   Chief Complaint  Patient presents with   Foot Swelling    left   HPI Stacie Pratt 46 y.o. female presents to office today with complaints of lateral lower left leg numbness and decreased sensitivity.   VRI was used to communicate directly with patient for the entire encounter including providing detailed patient instructions.    Notices lower left leg swelling when she is standing for prolonged hours during the day. Also has numbness in the lower leg near the ankle and decreased sensation in the upper leg near the calf area. She is experiencing tingling sensation in the left foot at times as well. Denies any unilateral weakness or hemiparesis. No impaired mobility.   Feels tingling in her legs if she holds her urine to long. Denies any incontinence.    She has chronic constipation. Was prescribed senna in the past for this but only uses it prn and is now taking an organic detox tea which I have advised against as I am unaware of the ingredients.    Anxiety and Depression Requesting referral for therapy. Currently only taking hydroxyzine prn for anxiety.      10/10/2021   11:44 AM 08/15/2021   10:18 AM 04/17/2021    9:09 AM  Depression screen PHQ 2/9  Decreased Interest 1  0  Down, Depressed, Hopeless 1 2 1   PHQ - 2 Score 2 2 1   Altered sleeping 0 0 0  Tired, decreased energy 1 2 1   Change in appetite 1 2 0  Feeling bad or failure about yourself  2 2 1   Trouble concentrating 1 0 1  Moving slowly or fidgety/restless 0 1 0  Suicidal thoughts 0 2 0  PHQ-9 Score 7 11 4        10/10/2021   11:44 AM 08/15/2021   10:18 AM 04/17/2021    9:10 AM 08/23/2020    1:42 PM  GAD 7 : Generalized Anxiety Score  Nervous, Anxious, on Edge 3 2 0 0  Control/stop worrying 3 1 0 0  Worry too much - different things 2 2 0 0  Trouble relaxing 0 0 0 0  Restless 1 0 0 0  Easily annoyed or irritable 3 3 0 0  Afraid - awful might happen 2 2 0 0  Total GAD 7 Score 14 10 0 0      Review of Systems  Constitutional:  Negative for fever, malaise/fatigue and weight loss.  HENT: Negative.  Negative for nosebleeds.   Eyes: Negative.  Negative for blurred vision, double vision and photophobia.  Respiratory: Negative.  Negative for cough and shortness of  breath.   Cardiovascular: Negative.  Negative for chest pain, palpitations and leg swelling.  Gastrointestinal:  Positive for constipation. Negative for abdominal pain, blood in stool, diarrhea, heartburn, melena, nausea and vomiting.  Musculoskeletal: Negative.  Negative for myalgias.  Neurological:  Positive for tingling and sensory change. Negative for dizziness, focal weakness, seizures and headaches.  Psychiatric/Behavioral:  Positive for depression. Negative for suicidal ideas. The patient is nervous/anxious.     Past Medical History:  Diagnosis Date   Hyperlipidemia     Past Surgical History:  Procedure Laterality Date   CESAREAN SECTION     3x   TUBAL LIGATION      Family History  Problem Relation Age of Onset   Healthy Mother    Healthy Father    Diabetes Sister     Social History Reviewed  with no changes to be made today.   Outpatient Medications Prior to Visit  Medication Sig Dispense Refill   hydrOXYzine (ATARAX) 25 MG tablet Take 1 tablet (25 mg total) by mouth 3 (three) times daily as needed. 60 tablet 1   No facility-administered medications prior to visit.    No Known Allergies     Objective:    BP 101/68   Pulse 60   Wt 140 lb 6.4 oz (63.7 kg)   LMP 09/08/2021   SpO2 100%   BMI 26.53 kg/m  Wt Readings from Last 3 Encounters:  10/10/21 140 lb 6.4 oz (63.7 kg)  08/15/21 137 lb 9.6 oz (62.4 kg)  04/17/21 128 lb 9.6 oz (58.3 kg)    Physical Exam Vitals and nursing note reviewed.  Constitutional:      Appearance: She is well-developed.  HENT:     Head: Normocephalic and atraumatic.  Cardiovascular:     Rate and Rhythm: Normal rate and regular rhythm.     Heart sounds: Normal heart sounds. No murmur heard.    No friction rub. No gallop.  Pulmonary:     Effort: Pulmonary effort is normal. No tachypnea or respiratory distress.     Breath sounds: Normal breath sounds. No decreased breath sounds, wheezing, rhonchi or rales.  Chest:     Chest wall: No tenderness.  Abdominal:     General: Bowel sounds are normal.     Palpations: Abdomen is soft.  Musculoskeletal:        General: Normal range of motion.     Cervical back: Normal range of motion.     Right lower leg: Normal.     Left lower leg: Normal.     Right ankle: Normal.     Left ankle: Normal.  Skin:    General: Skin is warm and dry.  Neurological:     Mental Status: She is alert and oriented to person, place, and time.     Coordination: Coordination normal.  Psychiatric:        Behavior: Behavior normal. Behavior is cooperative.        Thought Content: Thought content normal.        Judgment: Judgment normal.          Patient has been counseled extensively about nutrition and exercise as well as the importance of adherence with medications and regular follow-up. The patient was given  clear instructions to go to ER or return to medical center if symptoms don't improve, worsen or new problems develop. The patient verbalized understanding.   Follow-up: Return in about 3 weeks (around 10/31/2021) for virtual visit in 3 weeks on a tuesday for  left leg pain. Claiborne Rigg, FNP-BC [facility] [place], [place] [phone]   10/10/2021, 1:15 PM

## 2021-10-11 LAB — BASIC METABOLIC PANEL
BUN/Creatinine Ratio: 15 (ref 9–23)
BUN: 13 mg/dL (ref 6–24)
CO2: 20 mmol/L (ref 20–29)
Calcium: 9.3 mg/dL (ref 8.7–10.2)
Chloride: 102 mmol/L (ref 96–106)
Creatinine, Ser: 0.85 mg/dL (ref 0.57–1.00)
Glucose: 92 mg/dL (ref 70–99)
Potassium: 4.6 mmol/L (ref 3.5–5.2)
Sodium: 135 mmol/L (ref 134–144)
eGFR: 85 mL/min/{1.73_m2} (ref >59)

## 2021-10-11 LAB — LIPID PANEL
Chol/HDL Ratio: 5.1 ratio — ABNORMAL HIGH (ref 0.0–4.4)
Cholesterol, Total: 224 mg/dL — ABNORMAL HIGH (ref 100–199)
HDL: 44 mg/dL (ref >39)
LDL Chol Calc (NIH): 150 mg/dL — ABNORMAL HIGH (ref 0–99)
Triglycerides: 163 mg/dL — ABNORMAL HIGH (ref 0–149)
VLDL Cholesterol Cal: 30 mg/dL (ref 5–40)

## 2021-10-11 LAB — THYROID PANEL WITH TSH
Free Thyroxine Index: 0.4 — ABNORMAL LOW (ref 1.2–4.9)
T3 Uptake Ratio: 19 % — ABNORMAL LOW (ref 24–39)
T4, Total: 2 ug/dL — ABNORMAL LOW (ref 4.5–12.0)
TSH: 249 u[IU]/mL — ABNORMAL HIGH (ref 0.450–4.500)

## 2021-10-13 ENCOUNTER — Other Ambulatory Visit: Payer: Self-pay | Admitting: Nurse Practitioner

## 2021-10-13 DIAGNOSIS — R7989 Other specified abnormal findings of blood chemistry: Secondary | ICD-10-CM

## 2021-11-04 ENCOUNTER — Telehealth: Payer: No Typology Code available for payment source | Admitting: Nurse Practitioner

## 2021-12-03 ENCOUNTER — Ambulatory Visit: Payer: Self-pay | Attending: Nurse Practitioner

## 2021-12-04 ENCOUNTER — Other Ambulatory Visit: Payer: Self-pay | Admitting: Nurse Practitioner

## 2021-12-04 DIAGNOSIS — E039 Hypothyroidism, unspecified: Secondary | ICD-10-CM

## 2021-12-04 LAB — THYROID PANEL WITH TSH
Free Thyroxine Index: 0.3 — ABNORMAL LOW (ref 1.2–4.9)
T3 Uptake Ratio: 18 % — ABNORMAL LOW (ref 24–39)
T4, Total: 1.5 ug/dL — CL (ref 4.5–12.0)
TSH: 591 u[IU]/mL — ABNORMAL HIGH (ref 0.450–4.500)

## 2021-12-04 MED ORDER — LEVOTHYROXINE SODIUM 88 MCG PO TABS
88.0000 ug | ORAL_TABLET | Freq: Every day | ORAL | 1 refills | Status: DC
  Filled 2021-12-04: qty 30, 30d supply, fill #0

## 2021-12-05 ENCOUNTER — Other Ambulatory Visit: Payer: Self-pay

## 2021-12-08 ENCOUNTER — Ambulatory Visit: Payer: Self-pay | Attending: Nurse Practitioner | Admitting: Nurse Practitioner

## 2021-12-08 ENCOUNTER — Encounter: Payer: Self-pay | Admitting: Nurse Practitioner

## 2021-12-08 ENCOUNTER — Other Ambulatory Visit: Payer: Self-pay

## 2021-12-08 DIAGNOSIS — E039 Hypothyroidism, unspecified: Secondary | ICD-10-CM

## 2021-12-08 DIAGNOSIS — R200 Anesthesia of skin: Secondary | ICD-10-CM

## 2021-12-08 NOTE — Progress Notes (Signed)
Virtual Visit via Telephone Note  I discussed the limitations, risks, security and privacy concerns of performing an evaluation and management service by telephone and the availability of in person appointments. I also discussed with the patient that there may be a patient responsible charge related to this service. The patient expressed understanding and agreed to proceed.    I connected with Stacie Pratt on 12/08/21  at   3:50 PM EDT  EDT by telephone and verified that I am speaking with the correct person using two identifiers.  Location of Patient: Private Residence   [facility] of Provider: [facility]    Persons participating in Telemedicine visit: Bertram Denver FNP-BC Majorie Rangel-Celedon  Spanish Interpreter ID [phone]   History of Present Illness: Telemedicine visit for: Left leg neuropathy  She was prescribed diclofenac gel on 10-10-2021 for left leg neuropathy with numbness in the lower leg near the ankle and decreased sensation in the upper leg near the calf area. A that time she also reported a tingling sensation in the left foot. Denied any impaired mobility. Today she notes significant improvement in her symptoms and at this time we will continue diclofenac get.   Hypothyroidism She just started levothyroxine 88 mg today due to new onset hypothyroidism with thyroid level >500. Will have her return in 4 weeks for repeat blood work. We had a lengthy discussion regarding hypothyroidism and strict medication adherence.    Past Medical History:  Diagnosis Date   Hyperlipidemia     Past Surgical History:  Procedure Laterality Date   CESAREAN SECTION     3x   TUBAL LIGATION      Family History  Problem Relation Age of Onset   Healthy Mother    Healthy Father    Diabetes Sister     Social History   Socioeconomic History   Marital status: Single    Spouse name: Not on file   Number of children: 3   Years of education: Not on  file   Highest education level: 6th grade  Occupational History   Not on file  Tobacco Use   Smoking status: Never   Smokeless tobacco: Never  Vaping Use   Vaping Use: Never used  Substance and Sexual Activity   Alcohol use: Never   Drug use: Never   Sexual activity: Yes    Birth control/protection: Surgical  Other Topics Concern   Not on file  Social History Narrative   Not on file   Social Determinants of Health   Financial Resource Strain: Not on file  Food Insecurity: Not on file  Transportation Needs: No Transportation Needs (10/12/2019)   PRAPARE - Administrator, Civil Service (Medical): No    Lack of Transportation (Non-Medical): No  Physical Activity: Not on file  Stress: Not on file  Social Connections: Not on file     Observations/Objective: Awake, alert and oriented x 3   Review of Systems  Constitutional:  Negative for fever, malaise/fatigue and weight loss.  HENT: Negative.  Negative for nosebleeds.   Eyes: Negative.  Negative for blurred vision, double vision and photophobia.  Respiratory: Negative.  Negative for cough and shortness of breath.   Cardiovascular: Negative.  Negative for chest pain, palpitations and leg swelling.  Gastrointestinal: Negative.  Negative for heartburn, nausea and vomiting.  Musculoskeletal: Negative.  Negative for myalgias.  Neurological: Negative.  Negative for dizziness, focal weakness, seizures and headaches.  Psychiatric/Behavioral: Negative.  Negative for suicidal ideas.  Assessment and Plan: Diagnoses and all orders for this visit:  Left leg numbness Improved.   Acquired hypothyroidism Take levothyroxine as prescribed.     Follow Up Instructions Return for 4 weeks TSH, .     I discussed the assessment and treatment plan with the patient. The patient was provided an opportunity to ask questions and all were answered. The patient agreed with the plan and demonstrated an understanding of the  instructions.   The patient was advised to call back or seek an in-person evaluation if the symptoms worsen or if the condition fails to improve as anticipated.  I provided 12 minutes of non-face-to-face time during this encounter including median intraservice time, reviewing previous notes, labs, imaging, medications and explaining diagnosis and management.  Claiborne Rigg, FNP-BC

## 2022-01-14 ENCOUNTER — Ambulatory Visit: Payer: Self-pay | Attending: Nurse Practitioner

## 2022-01-14 DIAGNOSIS — E039 Hypothyroidism, unspecified: Secondary | ICD-10-CM

## 2022-01-15 LAB — TSH: TSH: 25.6 u[IU]/mL — ABNORMAL HIGH (ref 0.450–4.500)

## 2022-01-15 LAB — T4, FREE: Free T4: 0.82 ng/dL (ref 0.82–1.77)

## 2022-01-15 LAB — T3, FREE: T3, Free: 1.9 pg/mL — ABNORMAL LOW (ref 2.0–4.4)

## 2022-01-19 ENCOUNTER — Other Ambulatory Visit: Payer: Self-pay | Admitting: Nurse Practitioner

## 2022-01-19 DIAGNOSIS — E039 Hypothyroidism, unspecified: Secondary | ICD-10-CM

## 2022-01-19 MED ORDER — LEVOTHYROXINE SODIUM 100 MCG PO TABS
100.0000 ug | ORAL_TABLET | Freq: Every day | ORAL | 1 refills | Status: DC
  Filled 2022-01-19: qty 30, 30d supply, fill #0
  Filled 2022-03-04: qty 30, 30d supply, fill #1

## 2022-01-20 ENCOUNTER — Other Ambulatory Visit: Payer: Self-pay

## 2022-01-21 ENCOUNTER — Other Ambulatory Visit: Payer: Self-pay

## 2022-02-19 ENCOUNTER — Other Ambulatory Visit: Payer: No Typology Code available for payment source

## 2022-02-26 ENCOUNTER — Ambulatory Visit: Payer: Self-pay | Attending: Nurse Practitioner

## 2022-02-26 DIAGNOSIS — E039 Hypothyroidism, unspecified: Secondary | ICD-10-CM

## 2022-02-27 LAB — THYROID PANEL WITH TSH
Free Thyroxine Index: 1.6 (ref 1.2–4.9)
T3 Uptake Ratio: 26 % (ref 24–39)
T4, Total: 6.1 ug/dL (ref 4.5–12.0)
TSH: 7.12 u[IU]/mL — ABNORMAL HIGH (ref 0.450–4.500)

## 2022-03-02 ENCOUNTER — Other Ambulatory Visit: Payer: Self-pay | Admitting: Nurse Practitioner

## 2022-03-02 DIAGNOSIS — E039 Hypothyroidism, unspecified: Secondary | ICD-10-CM

## 2022-03-04 ENCOUNTER — Other Ambulatory Visit: Payer: Self-pay

## 2022-03-04 ENCOUNTER — Other Ambulatory Visit: Payer: Self-pay | Admitting: Pharmacist

## 2022-03-04 DIAGNOSIS — E039 Hypothyroidism, unspecified: Secondary | ICD-10-CM

## 2022-03-04 MED ORDER — LEVOTHYROXINE SODIUM 100 MCG PO TABS
100.0000 ug | ORAL_TABLET | Freq: Every day | ORAL | 1 refills | Status: DC
  Filled 2022-04-08: qty 30, 30d supply, fill #0

## 2022-04-08 ENCOUNTER — Other Ambulatory Visit: Payer: Self-pay

## 2022-05-06 ENCOUNTER — Ambulatory Visit: Payer: Self-pay | Attending: Nurse Practitioner

## 2022-05-06 DIAGNOSIS — E039 Hypothyroidism, unspecified: Secondary | ICD-10-CM

## 2022-05-07 ENCOUNTER — Other Ambulatory Visit: Payer: Self-pay | Admitting: Nurse Practitioner

## 2022-05-07 DIAGNOSIS — E039 Hypothyroidism, unspecified: Secondary | ICD-10-CM

## 2022-05-07 LAB — THYROID PANEL WITH TSH
Free Thyroxine Index: 2.6 (ref 1.2–4.9)
T3 Uptake Ratio: 29 % (ref 24–39)
T4, Total: 8.9 ug/dL (ref 4.5–12.0)
TSH: 4.62 u[IU]/mL — ABNORMAL HIGH (ref 0.450–4.500)

## 2022-05-07 MED ORDER — LEVOTHYROXINE SODIUM 100 MCG PO TABS
100.0000 ug | ORAL_TABLET | Freq: Every day | ORAL | 1 refills | Status: DC
  Filled 2022-05-07: qty 90, 90d supply, fill #0
  Filled 2022-08-12: qty 90, 90d supply, fill #1

## 2022-05-08 ENCOUNTER — Other Ambulatory Visit: Payer: Self-pay

## 2022-05-12 ENCOUNTER — Other Ambulatory Visit: Payer: Self-pay

## 2022-06-15 ENCOUNTER — Ambulatory Visit: Payer: Self-pay | Attending: Family Medicine

## 2022-06-19 ENCOUNTER — Ambulatory Visit: Payer: No Typology Code available for payment source

## 2022-07-21 ENCOUNTER — Encounter: Payer: Self-pay | Admitting: Nurse Practitioner

## 2022-07-21 ENCOUNTER — Other Ambulatory Visit (HOSPITAL_COMMUNITY)
Admission: RE | Admit: 2022-07-21 | Discharge: 2022-07-21 | Disposition: A | Discharge status: Home / Self Care | Payer: Self-pay | Source: Ambulatory Visit | Attending: Nurse Practitioner | Admitting: Nurse Practitioner

## 2022-07-21 ENCOUNTER — Ambulatory Visit: Payer: Self-pay | Attending: Nurse Practitioner | Admitting: Nurse Practitioner

## 2022-07-21 VITALS — BP 103/68 | HR 69 | Ht 61.0 in | Wt 138.4 lb

## 2022-07-21 DIAGNOSIS — N760 Acute vaginitis: Secondary | ICD-10-CM

## 2022-07-21 DIAGNOSIS — N898 Other specified noninflammatory disorders of vagina: Secondary | ICD-10-CM | POA: Insufficient documentation

## 2022-07-21 LAB — POCT URINALYSIS DIP (CLINITEK)
Bilirubin, UA: NEGATIVE
Blood, UA: NEGATIVE
Glucose, UA: NEGATIVE mg/dL
Ketones, POC UA: NEGATIVE mg/dL
Leukocytes, UA: NEGATIVE
Nitrite, UA: NEGATIVE
POC PROTEIN,UA: NEGATIVE
Spec Grav, UA: 1.01 (ref 1.010–1.025)
Urobilinogen, UA: 0.2 E.U./dL
pH, UA: 7 (ref 5.0–8.0)

## 2022-07-21 NOTE — Progress Notes (Signed)
Vaginal discharge and itching started two weeks ago. Patient has used a vaginal cream with no relief.

## 2022-07-21 NOTE — Progress Notes (Signed)
Assessment & Plan:  Stacie Pratt was seen today for vaginal discharge.  Diagnoses and all orders for this visit:  Acute vaginitis -     Cervicovaginal ancillary only -     POCT URINALYSIS DIP (CLINITEK)    Patient has been counseled on age-appropriate routine health concerns for screening and prevention. These are reviewed and up-to-date. Referrals have been placed accordingly. Immunizations are up-to-date or declined.    Subjective:   Chief Complaint  Patient presents with   Vaginal Discharge   HPI Stacie Pratt 47 y.o. female presents to office today with complaints of vaginitis  She has a past medical history of Hyperlipidemia, hypothyroidism and anxiety.   VRI was used to communicate directly with patient for the entire encounter including providing detailed patient instructions.     Vaginitis: Patient complains of an abnormal vaginal discharge for several days. Vaginal symptoms include discharge described as thick and local irritation.Vulvar symptoms include none.STI Risk: Very low risk of STD exposure.Other associated symptoms: none.Menstrual pattern: She had been bleeding regularly. Contraception: none  She had numerous questions regarding her hypothyroidism, medication administration and symptoms of hypo and hyperthyroidism.  All of her questions were answered and she verbalized understanding.   Review of Systems  Constitutional:  Negative for fever, malaise/fatigue and weight loss.  HENT: Negative.  Negative for nosebleeds.   Eyes: Negative.  Negative for blurred vision, double vision and photophobia.  Respiratory: Negative.  Negative for cough and shortness of breath.   Cardiovascular: Negative.  Negative for chest pain, palpitations and leg swelling.  Gastrointestinal: Negative.  Negative for heartburn, nausea and vomiting.  Genitourinary:        Vaginitis symptoms  Musculoskeletal: Negative.  Negative for myalgias.  Neurological: Negative.  Negative for  dizziness, focal weakness, seizures and headaches.  Psychiatric/Behavioral: Negative.  Negative for suicidal ideas.     Past Medical History:  Diagnosis Date   Anxiety    Hyperlipidemia    Thyroid disease     Past Surgical History:  Procedure Laterality Date   CESAREAN SECTION     3x   TUBAL LIGATION      Family History  Problem Relation Age of Onset   Healthy Mother    Healthy Father    Diabetes Sister     Social History Reviewed with no changes to be made today.   Outpatient Medications Prior to Visit  Medication Sig Dispense Refill   levothyroxine (SYNTHROID) 100 MCG tablet Take 1 tablet (100 mcg total) by mouth daily. 90 tablet 1   sennosides-docusate sodium (SENOKOT-S) 8.6-50 MG tablet Take 2 tablets by mouth once daily. 60 tablet 6   hydrOXYzine (ATARAX) 25 MG tablet Take 1 tablet (25 mg total) by mouth 3 (three) times daily as needed. (Patient not taking: Reported on 07/21/2022) 60 tablet 1   No facility-administered medications prior to visit.    No Known Allergies     Objective:    BP 103/68   Pulse 69   Ht 5\' 1"  (1.549 m)   Wt 138 lb 6.4 oz (62.8 kg)   LMP 07/03/2022 (Approximate)   SpO2 99%   BMI 26.15 kg/m  Wt Readings from Last 3 Encounters:  07/21/22 138 lb 6.4 oz (62.8 kg)  10/10/21 140 lb 6.4 oz (63.7 kg)  08/15/21 137 lb 9.6 oz (62.4 kg)    Physical Exam Vitals and nursing note reviewed.  Constitutional:      Appearance: She is well-developed.  HENT:     Head: Normocephalic and  atraumatic.  Cardiovascular:     Rate and Rhythm: Normal rate and regular rhythm.     Heart sounds: Normal heart sounds. No murmur heard.    No friction rub. No gallop.  Pulmonary:     Effort: Pulmonary effort is normal. No tachypnea or respiratory distress.     Breath sounds: Normal breath sounds. No decreased breath sounds, wheezing, rhonchi or rales.  Chest:     Chest wall: No tenderness.  Abdominal:     General: Bowel sounds are normal.     Palpations:  Abdomen is soft.  Genitourinary:    Comments: Self swab Musculoskeletal:        General: Normal range of motion.     Cervical back: Normal range of motion.  Skin:    General: Skin is warm and dry.  Neurological:     Mental Status: She is alert and oriented to person, place, and time.     Coordination: Coordination normal.  Psychiatric:        Behavior: Behavior normal. Behavior is cooperative.        Thought Content: Thought content normal.        Judgment: Judgment normal.          Patient has been counseled extensively about nutrition and exercise as well as the importance of adherence with medications and regular follow-up. The patient was given clear instructions to go to ER or return to medical center if symptoms don't improve, worsen or new problems develop. The patient verbalized understanding.   Follow-up: Return in about 4 months (around 11/20/2022) for thyroid.   Gildardo Pounds, FNP-BC [facility], [place]   07/21/2022, 5:56 PM

## 2022-07-23 ENCOUNTER — Other Ambulatory Visit: Payer: Self-pay | Admitting: Nurse Practitioner

## 2022-07-23 LAB — CERVICOVAGINAL ANCILLARY ONLY
Bacterial Vaginitis (gardnerella): POSITIVE — AB
Candida Glabrata: NEGATIVE
Candida Vaginitis: NEGATIVE
Chlamydia: NEGATIVE
Comment: NEGATIVE
Comment: NEGATIVE
Comment: NEGATIVE
Comment: NEGATIVE
Comment: NEGATIVE
Comment: NORMAL
Neisseria Gonorrhea: NEGATIVE
Trichomonas: NEGATIVE

## 2022-07-23 MED ORDER — METRONIDAZOLE 500 MG PO TABS
500.0000 mg | ORAL_TABLET | Freq: Two times a day (BID) | ORAL | 0 refills | Status: AC
  Filled 2022-07-23: qty 14, 7d supply, fill #0

## 2022-07-24 ENCOUNTER — Other Ambulatory Visit: Payer: Self-pay

## 2022-07-30 ENCOUNTER — Other Ambulatory Visit: Payer: Self-pay

## 2022-08-12 ENCOUNTER — Other Ambulatory Visit: Payer: Self-pay

## 2022-08-13 ENCOUNTER — Other Ambulatory Visit: Payer: Self-pay

## 2022-08-26 ENCOUNTER — Encounter: Payer: Self-pay | Admitting: Physician Assistant

## 2022-08-26 ENCOUNTER — Ambulatory Visit: Payer: Self-pay | Admitting: Physician Assistant

## 2022-08-26 ENCOUNTER — Other Ambulatory Visit: Payer: Self-pay

## 2022-08-26 VITALS — BP 124/81 | HR 83 | Ht 59.5 in | Wt 136.0 lb

## 2022-08-26 DIAGNOSIS — M722 Plantar fascial fibromatosis: Secondary | ICD-10-CM

## 2022-08-26 DIAGNOSIS — M79671 Pain in right foot: Secondary | ICD-10-CM

## 2022-08-26 DIAGNOSIS — E038 Other specified hypothyroidism: Secondary | ICD-10-CM

## 2022-08-26 MED ORDER — IBUPROFEN 600 MG PO TABS
600.0000 mg | ORAL_TABLET | Freq: Three times a day (TID) | ORAL | 0 refills | Status: DC | PRN
  Filled 2022-08-26: qty 30, 10d supply, fill #0

## 2022-08-26 NOTE — Patient Instructions (Signed)
You are going to use ibuprofen 600 mg every 8 hours , roll your foot on frozen water bottles, and wear good supportive shoes.  Please let us know if there is anything else we can do for you  Roney Jaffe, PA-C Physician Assistant [facility] [email or web address]  Fascitis plantar Plantar Fasciitis  La fascitis plantar es una afeccin dolorosa que se produce en el taln. Ocurre cuando la banda de tejido que [employer] dedos con el hueso del taln (fascia plantar) se irrita. Esto puede ocurrir por Academic librarian ejercicio u otras actividades repetitivas (lesin por uso excesivo). La fascitis plantar puede causar desde una leve irritacin hasta dolor intenso que dificulta que la persona camine o se [place]. Por lo general, el dolor es peor a la maana despus de dormir, o despus de Personal assistant sentado o acostado durante un perodo de [place]. El dolor tambin puede empeorar despus de caminar o estar de pie por [employer]. Cules son las causas? Esta afeccin puede ser causada por lo siguiente: Estar de pie durante largos perodos. Usar zapatos que no tengan un buen soporte para el arco. Realizar actividades que implican esfuerzo para las articulaciones (actividades de alto impacto). Esto incluye el ballet y la actividad fsica que hace que el corazn lata ms rpido (ejercicio aerbico), Licensed conveyancer. Tener sobrepeso. Tener una forma de caminar (andar) anormal. Presentar rigidez muscular en la parte posterior de la parte inferior de la pierna (pantorrilla). Arcos Google o pies planos. Comenzar una nueva actividad fsica. Cules son los signos o sntomas? El sntoma principal de esta afeccin es el dolor en el taln. El dolor puede empeorar despus de lo siguiente: Con los primeros pasos luego de estar en reposo, especialmente por la maana despus de dormir o de haber estado sentado o acostado durante un [place]. Largos perodos  de Personal assistant de pie. El dolor puede disminuir despus de 30 a 45 minutos de [place], como caminar apaciblemente. Cmo se diagnostica? Esta afeccin se puede diagnosticar en funcin de los antecedentes mdicos, un examen fsico y los sntomas. El mdico controlar lo siguiente: Un rea dolorida en la parte inferior del pie. Arco alto en el pie o pies planos. Dolor al Doctor, general practice. Dificultad para mover el pie. Pueden realizarle estudios de diagnstico por imagen para confirmar el diagnstico, por ejemplo: Radiografas. Ecografa. Resonancia magntica (RM). Cmo se trata? El tratamiento de la fascitis plantar depende de la gravedad de su afeccin. El tratamiento puede incluir: Reposo, hielo, presin (compresin) y Lexicographer (elevar) el pie afectado. Esto se denomina tratamiento de RHCE (reposo, hielo, compresin, elevacin). El mdico puede recomendarle terapia de RHCE junto con medicamentos de venta libre para Engineer, materials. Ejercicios para estirar las pantorrillas y la fascia plantar. Una frula que [employer] estirado y [place] mientras usted duerme (frula nocturna). Fisioterapia para [employer] sntomas y Physiological scientist en el futuro. Inyecciones de medicamentos con corticoesteroides (cortisona) para Engineer, materials y la inflamacin. Estimular su fascia plantar lesionada con impulsos elctricos (tratamiento con ondas de choque extracorpreas). Esto generalmente es la ltima opcin de tratamiento antes de la [place]. [place], si los otros tratamientos no han funcionado despus de 12 meses. Siga estas instrucciones en su casa: Control del dolor, la rigidez y la hinchazn  Si se lo indican, aplique hielo sobre la zona dolorida. Para hacer esto: Ponga el hielo en una bolsa de plstico o use una botella de [place]. Coloque una toalla entre la piel y  la bolsa de hielo o la botella. Frote la parte inferior del pie sobre la bolsa o la botella. Haga esto durante 20  minutos, de 2 a 3 veces al [employer]. Use calzado deportivo con amortiguacin de aire o gel, o pruebe usar plantillas blandas diseadas para la fascitis plantar. Cuando est sentado o acostado, eleve el pie por encima del nivel del corazn. Actividad Evite las actividades que le causan dolor. Pregntele al mdico qu actividades son seguras para usted. Haga los ejercicios de fisioterapia y estiramiento como se lo haya indicado el mdico. Intente hacer actividades y tipos de ejercicio que sean ms suaves para las articulaciones (de bajo impacto). Por ejemplo, nadar, hacer ejercicios [employer], y Lobbyist. Instrucciones generales Use los medicamentos de venta libre y los recetados solamente como se lo haya indicado el mdico. Si el mdico se lo indica, use una frula nocturna para dormir. Afloje la frula si los dedos de los pies se le entumecen, siente hormigueos o se le enfran y se tornan de Research officer, trade union. Mantenga un peso saludable, o colabore con su mdico para perder [employer]. Cumpla con todas las visitas de seguimiento. Esto es importante. Comunquese con un mdico si tiene: Sntomas que no desaparecen con Environmental consultant. Dolor que [place]. Dolor que afecta su capacidad de moverse o de Education officer, environmental sus actividades diarias. Resumen La fascitis plantar es una afeccin dolorosa que se produce en el taln. Ocurre cuando la banda de tejido que [employer] dedos con el hueso del taln (fascia plantar) se irrita. El dolor en el taln es el sntoma principal de esta afeccin. Puede empeorar despus de Research scientist (medical) ejercicio o de Personal assistant quieto de pie durante mucho tiempo. El tratamiento vara, pero normalmente comienza con el reposo, la aplicacin de hielo, la aplicacin de presin (compresin) y la elevacin del pie afectado. Esto se denomina tratamiento de RHCE (reposo, hielo, compresin, elevacin). Tambin se pueden usar analgsicos de venta libre para Human resources officer. Esta  informacin no tiene Theme park manager el consejo del mdico. Asegrese de hacerle al mdico cualquier pregunta que tenga. Document Revised: 10/16/2019 Document Reviewed: 10/16/2019 Elsevier Patient Education  2023 [employer].
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
## 2022-08-27 ENCOUNTER — Encounter: Payer: Self-pay | Admitting: Physician Assistant

## 2022-09-03 ENCOUNTER — Ambulatory Visit: Payer: Self-pay | Attending: Nurse Practitioner

## 2022-09-03 ENCOUNTER — Ambulatory Visit: Payer: Self-pay

## 2022-11-05 ENCOUNTER — Other Ambulatory Visit: Payer: Self-pay | Admitting: Pharmacist

## 2022-11-05 ENCOUNTER — Other Ambulatory Visit: Payer: Self-pay

## 2022-11-05 DIAGNOSIS — E039 Hypothyroidism, unspecified: Secondary | ICD-10-CM

## 2022-11-05 MED ORDER — LEVOTHYROXINE SODIUM 100 MCG PO TABS
100.0000 ug | ORAL_TABLET | Freq: Every day | ORAL | 0 refills | Status: DC
  Filled 2022-11-05: qty 30, 30d supply, fill #0

## 2022-11-23 ENCOUNTER — Encounter: Payer: Self-pay | Admitting: Nurse Practitioner

## 2022-11-23 ENCOUNTER — Ambulatory Visit: Payer: Self-pay | Attending: Nurse Practitioner | Admitting: Nurse Practitioner

## 2022-11-23 VITALS — BP 106/72 | HR 64 | Ht 59.5 in | Wt 133.6 lb

## 2022-11-23 DIAGNOSIS — Z1231 Encounter for screening mammogram for malignant neoplasm of breast: Secondary | ICD-10-CM

## 2022-11-23 DIAGNOSIS — R7303 Prediabetes: Secondary | ICD-10-CM

## 2022-11-23 DIAGNOSIS — E039 Hypothyroidism, unspecified: Secondary | ICD-10-CM

## 2022-11-23 DIAGNOSIS — Z862 Personal history of diseases of the blood and blood-forming organs and certain disorders involving the immune mechanism: Secondary | ICD-10-CM

## 2022-11-23 DIAGNOSIS — M79671 Pain in right foot: Secondary | ICD-10-CM

## 2022-11-23 DIAGNOSIS — E785 Hyperlipidemia, unspecified: Secondary | ICD-10-CM

## 2022-11-23 NOTE — Progress Notes (Signed)
Assessment & Plan:  Stacie Pratt was seen today for medical management of chronic issues and foot pain.  Diagnoses and all orders for this visit:  Acquired hypothyroidism -     Thyroid Panel With TSH  Prediabetes -     CMP14+EGFR -     Hemoglobin A1c  Breast cancer screening by mammogram -     MS 3D SCR MAMMO BILAT BR (aka MM); Future  Dyslipidemia, goal LDL below 100 -     Lipid panel  History of anemia -     CBC with Differential  Right foot pain -     Ambulatory referral to Podiatry    Patient has been counseled on age-appropriate routine health concerns for screening and prevention. These are reviewed and up-to-date. Referrals have been placed accordingly. Immunizations are up-to-date or declined.    Subjective:   Chief Complaint  Patient presents with   Medical Management of Chronic Issues    Thyroid   Foot Pain    Right foot pain.    HPI Stacie Pratt 47 y.o. female presents to office today for follow up to hypothyroidism  VRI was used to communicate directly with patient for the entire encounter including providing detailed patient instructions.     Notes epigastric chest pain last night after eating 3 mandarin oranges. Chest pain has currently resolved. States she took ibuprofen last night.   She was evaluated for chronic [facility] foot pain [facility] a few months ago. Prescribed ibuprofen at that time for presumed plantar fasciitis.  Pain is mostly in the heel and arch of the foot.  Worse after sitting down for prolonged period of time and standing back up to walk on her feet.  She also has onychomycosis of the right big toenail and would like to see a podiatrist for this.    Hypothyroidism Thyroid level is low and I have instructed her to make sure she is taking her levothyroxine every day around the same time and not with any other medications.  We may need to decrease dosage although she has been on the 100 mg dose since last year.  She denies  any symptoms of hypo or hyperthyroidism at this time Lab Results  Component Value Date   TSH 0.441 (L) 11/23/2022    Prediabetes Well-controlled with diet only. Lab Results  Component Value Date   HGBA1C 5.5 11/23/2022    Review of Systems  Constitutional:  Negative for fever, malaise/fatigue and weight loss.  HENT: Negative.  Negative for nosebleeds.   Eyes: Negative.  Negative for blurred vision, double vision and photophobia.  Respiratory: Negative.  Negative for cough and shortness of breath.   Cardiovascular: Negative.  Negative for chest pain, palpitations and leg swelling.  Gastrointestinal: Negative.  Negative for heartburn, nausea and vomiting.  Musculoskeletal:  Positive for joint pain (See HPI). Negative for myalgias.  Neurological: Negative.  Negative for dizziness, focal weakness, seizures and headaches.  Psychiatric/Behavioral: Negative.  Negative for suicidal ideas.     Past Medical History:  Diagnosis Date   Anxiety    Hyperlipidemia    Thyroid disease     Past Surgical History:  Procedure Laterality Date   CESAREAN SECTION     3x   TUBAL LIGATION      Family History  Problem Relation Age of Onset   Healthy Mother    Healthy Father    Diabetes Sister     Social History Reviewed with no changes to be made today.  Outpatient Medications Prior to Visit  Medication Sig Dispense Refill   sennosides-docusate sodium (SENOKOT-S) 8.6-50 MG tablet Take 2 tablets by mouth once daily. 60 tablet 6   levothyroxine (SYNTHROID) 100 MCG tablet Take 1 tablet (100 mcg total) by mouth daily. 30 tablet 0   hydrOXYzine (ATARAX) 25 MG tablet Take 1 tablet (25 mg total) by mouth 3 (three) times daily as needed. (Patient not taking: Reported on 07/21/2022) 60 tablet 1   ibuprofen (ADVIL) 600 MG tablet Take 1 tablet (600 mg total) by mouth every 8 (eight) hours as needed. (Patient not taking: Reported on 11/23/2022) 30 tablet 0   No facility-administered medications prior to  visit.    No Known Allergies     Objective:    BP 106/72 (BP Location: Left Arm, Patient Position: Sitting, Cuff Size: Normal)   Pulse 64   Ht 4' 11.5" (1.511 m)   Wt 133 lb 9.6 oz (60.6 kg)   LMP 10/26/2022 (Approximate)   SpO2 99%   BMI 26.53 kg/m  Wt Readings from Last 3 Encounters:  11/23/22 133 lb 9.6 oz (60.6 kg)  08/26/22 136 lb (61.7 kg)  07/21/22 138 lb 6.4 oz (62.8 kg)    Physical Exam Vitals and nursing note reviewed.  Constitutional:      Appearance: She is well-developed.  HENT:     Head: Normocephalic and atraumatic.  Cardiovascular:     Rate and Rhythm: Normal rate and regular rhythm.     Heart sounds: Normal heart sounds. No murmur heard.    No friction rub. No gallop.  Pulmonary:     Effort: Pulmonary effort is normal. No tachypnea or respiratory distress.     Breath sounds: Normal breath sounds. No decreased breath sounds, wheezing, rhonchi or rales.  Chest:     Chest wall: No tenderness.  Abdominal:     General: Bowel sounds are normal.     Palpations: Abdomen is soft.  Musculoskeletal:        General: Normal range of motion.     Cervical back: Normal range of motion.     Right foot: No deformity.  Feet:     Right foot:     Skin integrity: Skin integrity normal.     Toenail Condition: Fungal disease present. Skin:    General: Skin is warm and dry.  Neurological:     Mental Status: She is alert and oriented to person, place, and time.     Coordination: Coordination normal.  Psychiatric:        Behavior: Behavior normal. Behavior is cooperative.        Thought Content: Thought content normal.        Judgment: Judgment normal.          Patient has been counseled extensively about nutrition and exercise as well as the importance of adherence with medications and regular follow-up. The patient was given clear instructions to go to ER or return to medical center if symptoms don't improve, worsen or new problems develop. The patient  verbalized understanding.   Follow-up: Return in about 3 months (around 02/23/2023) for PAP smear.   Claiborne Rigg, FNP-BC [facility] [place], [place] [phone]   12/01/2022, 8:20 AM

## 2022-11-30 ENCOUNTER — Other Ambulatory Visit: Payer: Self-pay

## 2022-11-30 ENCOUNTER — Encounter: Payer: Self-pay | Admitting: Nurse Practitioner

## 2022-11-30 ENCOUNTER — Other Ambulatory Visit: Payer: Self-pay | Admitting: Nurse Practitioner

## 2022-11-30 ENCOUNTER — Other Ambulatory Visit (HOSPITAL_COMMUNITY): Payer: Self-pay

## 2022-11-30 DIAGNOSIS — E039 Hypothyroidism, unspecified: Secondary | ICD-10-CM

## 2022-11-30 MED ORDER — LEVOTHYROXINE SODIUM 100 MCG PO TABS
100.0000 ug | ORAL_TABLET | Freq: Every day | ORAL | 0 refills | Status: DC
  Filled 2022-11-30 – 2022-12-08 (×2): qty 30, 30d supply, fill #0

## 2022-12-01 ENCOUNTER — Other Ambulatory Visit: Payer: Self-pay | Admitting: Obstetrics and Gynecology

## 2022-12-01 DIAGNOSIS — Z1231 Encounter for screening mammogram for malignant neoplasm of breast: Secondary | ICD-10-CM

## 2022-12-07 ENCOUNTER — Other Ambulatory Visit: Payer: Self-pay

## 2022-12-08 ENCOUNTER — Other Ambulatory Visit: Payer: Self-pay

## 2022-12-08 ENCOUNTER — Ambulatory Visit: Payer: Self-pay | Admitting: Podiatry

## 2022-12-15 ENCOUNTER — Ambulatory Visit: Payer: Self-pay

## 2022-12-29 ENCOUNTER — Ambulatory Visit: Payer: Self-pay | Attending: Nurse Practitioner

## 2022-12-29 DIAGNOSIS — R7989 Other specified abnormal findings of blood chemistry: Secondary | ICD-10-CM

## 2022-12-31 ENCOUNTER — Encounter: Payer: Self-pay | Admitting: Podiatry

## 2022-12-31 ENCOUNTER — Ambulatory Visit (INDEPENDENT_AMBULATORY_CARE_PROVIDER_SITE_OTHER): Payer: No Typology Code available for payment source | Admitting: Podiatry

## 2022-12-31 ENCOUNTER — Ambulatory Visit (INDEPENDENT_AMBULATORY_CARE_PROVIDER_SITE_OTHER): Payer: No Typology Code available for payment source

## 2022-12-31 ENCOUNTER — Other Ambulatory Visit: Payer: Self-pay

## 2022-12-31 DIAGNOSIS — M722 Plantar fascial fibromatosis: Secondary | ICD-10-CM

## 2022-12-31 DIAGNOSIS — M79671 Pain in right foot: Secondary | ICD-10-CM

## 2022-12-31 LAB — THYROID PANEL WITH TSH
Free Thyroxine Index: 3.6 (ref 1.2–4.9)
T3 Uptake Ratio: 34 % (ref 24–39)
T4, Total: 10.5 ug/dL (ref 4.5–12.0)
TSH: 0.289 u[IU]/mL — ABNORMAL LOW (ref 0.450–4.500)

## 2022-12-31 MED ORDER — TRIAMCINOLONE ACETONIDE 40 MG/ML IJ SUSP
20.0000 mg | Freq: Once | INTRAMUSCULAR | Status: AC
  Administered 2022-12-31: 20 mg

## 2022-12-31 MED ORDER — MELOXICAM 15 MG PO TABS
15.0000 mg | ORAL_TABLET | Freq: Every day | ORAL | 3 refills | Status: DC
  Filled 2022-12-31: qty 30, 30d supply, fill #0

## 2022-12-31 MED ORDER — METHYLPREDNISOLONE 4 MG PO TBPK
ORAL_TABLET | ORAL | 0 refills | Status: AC
  Filled 2022-12-31: qty 21, 6d supply, fill #0

## 2022-12-31 NOTE — Progress Notes (Signed)
Subjective:  Patient ID: Stacie Pratt, female    DOB: February 06, 1976,  MRN: [id number] HPI Chief Complaint  Patient presents with   Foot Pain    Plantar heel right - sharp sensations since April, radiating into medial ankle, went to mobile [facility] and was given Ibuprofen and also bought OTC insoles - maybe some help, but still hurting   Nail Problem    Hallux nail right - thick and discolored, would like it treated   New Patient (Initial Visit)    47 y.o. female presents with the above complaint.   ROS: Denies fever chills nausea vomiting muscle aches pains calf pain back pain chest pain shortness of breath.  Past Medical History:  Diagnosis Date   Anxiety    Hyperlipidemia    Thyroid disease    Past Surgical History:  Procedure Laterality Date   CESAREAN SECTION     3x   TUBAL LIGATION      Current Outpatient Medications:    meloxicam (MOBIC) 15 MG tablet, Take 1 tablet (15 mg total) by mouth daily., Disp: 30 tablet, Rfl: 3   methylPREDNISolone (MEDROL DOSEPAK) 4 MG TBPK tablet, Take 6 tablets (24 mg total) by mouth daily for 1 day, THEN 5 tablets (20 mg total) daily for 1 day, THEN 4 tablets (16 mg total) daily for 1 day, THEN 3 tablets (12 mg total) daily for 1 day, THEN 2 tablets (8 mg total) daily for 1 day, THEN 1 tablet (4 mg total) daily for 1 day., Disp: 21 tablet, Rfl: 0   levothyroxine (SYNTHROID) 100 MCG tablet, Take 1 tablet (100 mcg total) by mouth daily., Disp: 30 tablet, Rfl: 0  No Known Allergies Review of Systems Objective:  There were no vitals filed for this visit.  General: Well developed, nourished, in no acute distress, alert and oriented x3   Dermatological: Skin is warm, dry and supple bilateral. Nails x 10 are well maintained; remaining integument appears unremarkable at this time. There are no open sores, no preulcerative lesions, no rash or signs of infection present.  Nail dystrophy possible onychomycosis hallux left the nail has been cut back  short there is no sample to take.  Vascular: Dorsalis Pedis artery and Posterior Tibial artery pedal pulses are 2/4 bilateral with immedate capillary fill time. Pedal hair growth present. No varicosities and no lower extremity edema present bilateral.   Neruologic: Grossly intact via light touch bilateral. Vibratory intact via tuning fork bilateral. Protective threshold with Semmes Wienstein monofilament intact to all pedal sites bilateral. Patellar and Achilles deep tendon reflexes 2+ bilateral. No Babinski or clonus noted bilateral.   Musculoskeletal: No gross boney pedal deformities bilateral. No pain, crepitus, or limitation noted with foot and ankle range of motion bilateral. Muscular strength 5/5 in all groups tested bilateral.  Pain on palpation medial calcaneal tubercle of the right heel.  Gait: Unassisted, Nonantalgic.    Radiographs:  Radiographs taken today demonstrate an osseously mature individual with good bone mineralization she does have soft tissue increase in density of the plantar fascia calcaneal insertion site of the right foot.  No acute findings otherwise noted.  Assessment & Plan:   Assessment: Planter fasciitis right foot.  Nail dystrophy hallux right.  Plan: Discussed etiology pathology conservative versus surgical therapies.  I injected the right heel 20 mg Kenalog 5 mg Marcaine point maximal tenderness.  Tolerated procedure well.  Start her on methylprednisolone to be followed by meloxicam.  Discussed appropriate shoe gear stretching exercise ice therapy and shoe  gear modifications.  I am going to asked that she not trim the hallux nail right so that we trim it and take samples of December pathology next visit.  Follow-up with her in 1 month     Aison Malveaux [place], [place]

## 2023-01-01 ENCOUNTER — Other Ambulatory Visit: Payer: Self-pay

## 2023-01-01 ENCOUNTER — Other Ambulatory Visit: Payer: Self-pay | Admitting: Nurse Practitioner

## 2023-01-01 DIAGNOSIS — E039 Hypothyroidism, unspecified: Secondary | ICD-10-CM

## 2023-01-01 MED ORDER — LEVOTHYROXINE SODIUM 88 MCG PO TABS
88.0000 ug | ORAL_TABLET | Freq: Every day | ORAL | 0 refills | Status: DC
  Filled 2023-01-01: qty 90, 90d supply, fill #0

## 2023-01-06 ENCOUNTER — Other Ambulatory Visit: Payer: Self-pay

## 2023-01-14 ENCOUNTER — Ambulatory Visit
Admission: RE | Admit: 2023-01-14 | Discharge: 2023-01-14 | Disposition: A | Discharge status: Home / Self Care | Payer: No Typology Code available for payment source | Source: Ambulatory Visit | Attending: Obstetrics and Gynecology | Admitting: Obstetrics and Gynecology

## 2023-01-14 ENCOUNTER — Other Ambulatory Visit: Payer: Self-pay

## 2023-01-14 ENCOUNTER — Ambulatory Visit: Payer: Self-pay | Admitting: Hematology and Oncology

## 2023-01-14 VITALS — BP 124/71 | Wt 136.0 lb

## 2023-01-14 DIAGNOSIS — Z1231 Encounter for screening mammogram for malignant neoplasm of breast: Secondary | ICD-10-CM

## 2023-01-14 DIAGNOSIS — Z1211 Encounter for screening for malignant neoplasm of colon: Secondary | ICD-10-CM

## 2023-01-14 NOTE — Progress Notes (Signed)
Stacie Pratt is a 47 y.o. female who presents to [facility] clinic today with no complaints.    Pap Smear: Pap not smear completed today. Last Pap smear was 10/04/2019 and was normal. Per patient has no history of an abnormal Pap smear. Last Pap smear result is available in Epic.   Physical exam: Breasts Breasts symmetrical. No skin abnormalities bilateral breasts. No nipple retraction bilateral breasts. No nipple discharge bilateral breasts. No lymphadenopathy. No lumps palpated bilateral breasts.       Pelvic/Bimanual Pap is not indicated today    Smoking History: Patient has never smoked and was not referred to quit line.    Patient Navigation: Patient education provided. Access to services provided for patient through [facility]CP program. Stacie Pratt interpreter provided. No transportation provided   Colorectal Cancer Screening: Per patient has never had colonoscopy completed No complaints today.    Breast and Cervical Cancer Risk Assessment: Patient does not have family history of breast cancer, known genetic mutations, or radiation treatment to the chest before age 17. Patient does not have history of cervical dysplasia, immunocompromised, or DES exposure in-utero.  Risk Scores as of Encounter on 01/14/2023     Stacie Pratt           5-year 0.92%   Lifetime 9.32%   This patient is [place]/Lat[place] but has no documented birth country, so the [place] model used data from [place] patients to calculate their risk score. Document a birth country in the Demographics activity for a more accurate score.         Last calculated by Stacie Pratt, CMA on 01/14/2023 at 10:58 AM          A: [facility] exam without pap smear No complaints with benign exam.   P: Referred patient to the [facility] for a screening mammogram. Appointment scheduled 01/14/2023.  Pascal Lux, NP 01/14/2023 10:36 AM

## 2023-01-14 NOTE — Patient Instructions (Signed)
Taught Stacie Pratt about self breast awareness and gave educational materials to take home. Patient did not need a Pap smear today due to last Pap smear was in  per patient. Let her know [facility] will cover Pap smears every 5 years unless has a history of abnormal Pap smears. Referred patient to the [facility] for screening mammogram. Appointment scheduled for 01/14/23. Patient aware of appointment and will be there. Let patient know will follow up with her within the next couple weeks with results. Stacie Pratt verbalized understanding.  Pascal Lux, NP 10:36 AM

## 2023-02-02 ENCOUNTER — Ambulatory Visit: Payer: No Typology Code available for payment source | Admitting: Podiatry

## 2023-02-05 ENCOUNTER — Other Ambulatory Visit: Payer: Self-pay

## 2023-02-09 ENCOUNTER — Encounter: Payer: Self-pay | Admitting: Podiatry

## 2023-02-09 ENCOUNTER — Ambulatory Visit (INDEPENDENT_AMBULATORY_CARE_PROVIDER_SITE_OTHER): Payer: No Typology Code available for payment source | Admitting: Podiatry

## 2023-02-09 DIAGNOSIS — M722 Plantar fascial fibromatosis: Secondary | ICD-10-CM

## 2023-02-09 DIAGNOSIS — L603 Nail dystrophy: Secondary | ICD-10-CM

## 2023-02-09 MED ORDER — TRIAMCINOLONE ACETONIDE 40 MG/ML IJ SUSP
20.0000 mg | Freq: Once | INTRAMUSCULAR | Status: AC
Start: 2023-02-09 — End: 2023-02-09
  Administered 2023-02-09: 20 mg

## 2023-02-09 NOTE — Progress Notes (Signed)
She presents today with her interpreter stating that she is doing much better approximately 75 to 80% improved for plantar fasciitis in the right foot.  She states that she is doing well and she is concerned about the toenail along the fibular border of the hallux right.  Last time she was then I asked her to allow this nail to grow out so we can take samples of it.  Objective: Vital signs are stable alert oriented x 3.  Pulses are palpable.  There is no erythema edema salines drainage odor she has some residual tenderness on palpation medial calcaneal tubercle of the right heel.  Thick yellow dystrophic clinical mycotic nail border hallux right.  Assessment: Pain in limb secondary to nail dystrophy.  She is resolving plantar fasciitis 75 to 80%.  Plan: Discussed etiology pathology and surgical therapies debrided toenail for samples today to be sent for pathologic evaluation.  I also injected the right heel today with 20 mg Kenalog 5 mg Marcaine point of maximal tenderness.  Tolerated procedure well without complications.  I will follow-up with her in 1 month

## 2023-02-10 ENCOUNTER — Other Ambulatory Visit: Payer: Self-pay

## 2023-02-11 ENCOUNTER — Ambulatory Visit: Payer: Self-pay | Attending: Nurse Practitioner

## 2023-02-11 ENCOUNTER — Other Ambulatory Visit: Payer: Self-pay

## 2023-02-11 DIAGNOSIS — E039 Hypothyroidism, unspecified: Secondary | ICD-10-CM

## 2023-02-12 LAB — THYROID PANEL WITH TSH
Free Thyroxine Index: 2.9 (ref 1.2–4.9)
T3 Uptake Ratio: 28 % (ref 24–39)
T4, Total: 10.2 ug/dL (ref 4.5–12.0)
TSH: 0.947 u[IU]/mL (ref 0.450–4.500)

## 2023-02-23 ENCOUNTER — Ambulatory Visit: Payer: Self-pay | Admitting: Nurse Practitioner

## 2023-03-03 ENCOUNTER — Ambulatory Visit: Payer: Self-pay | Admitting: Nurse Practitioner

## 2023-03-08 LAB — CULT, FUNGUS, SKIN,HAIR,NAIL W/KOH
MICRO NUMBER:: 15596932
SPECIMEN QUALITY:: ADEQUATE

## 2023-03-09 ENCOUNTER — Other Ambulatory Visit: Payer: Self-pay

## 2023-03-09 ENCOUNTER — Ambulatory Visit (INDEPENDENT_AMBULATORY_CARE_PROVIDER_SITE_OTHER): Payer: No Typology Code available for payment source | Admitting: Podiatry

## 2023-03-09 DIAGNOSIS — M722 Plantar fascial fibromatosis: Secondary | ICD-10-CM

## 2023-03-09 DIAGNOSIS — L603 Nail dystrophy: Secondary | ICD-10-CM

## 2023-03-09 MED ORDER — TRIAMCINOLONE ACETONIDE 40 MG/ML IJ SUSP
20.0000 mg | Freq: Once | INTRAMUSCULAR | Status: AC
Start: 2023-03-09 — End: 2023-03-09
  Administered 2023-03-09: 20 mg

## 2023-03-09 MED ORDER — TERBINAFINE HCL 250 MG PO TABS
250.0000 mg | ORAL_TABLET | Freq: Every day | ORAL | 0 refills | Status: DC
  Filled 2023-03-09: qty 30, 30d supply, fill #0

## 2023-03-09 NOTE — Progress Notes (Signed)
She presents today for follow-up of her Planter fasciitis right.  With her interpreter here today she relates that her right heel is approximately 80% improved.  She also is concerned about the fungal portion of the nail and the report for the samples that we took last time.  Objective: Vital signs are stable alert and oriented x 3.  Pulses are palpable.  There is no erythema edema salines drainage or odor.  She has some tenderness on palpation MucoClear tubercle but much improved to the right foot.  Pathology for the nail does demonstrate fungus.  Assessment onychomycosis as well as resolving plantar fasciitis right.  Plan: Reinjected her right heel today 20 mg Kenalog 5 mg Marcaine point maximal tenderness.  Tolerated procedure well without complications.  Started her on Lamisil 250 mg tablets 1 p.o. daily we discussed the possible side effects and allergic reactions associated with this drug she understands and is amenable to it I will follow-up with her in 1 month.

## 2023-03-23 ENCOUNTER — Ambulatory Visit: Payer: Self-pay | Attending: Nurse Practitioner | Admitting: Nurse Practitioner

## 2023-03-23 ENCOUNTER — Encounter: Payer: Self-pay | Admitting: Nurse Practitioner

## 2023-03-23 ENCOUNTER — Other Ambulatory Visit: Payer: Self-pay

## 2023-03-23 VITALS — BP 108/72 | HR 68 | Ht 59.5 in | Wt 135.8 lb

## 2023-03-23 DIAGNOSIS — Z23 Encounter for immunization: Secondary | ICD-10-CM

## 2023-03-23 DIAGNOSIS — E039 Hypothyroidism, unspecified: Secondary | ICD-10-CM

## 2023-03-23 DIAGNOSIS — N944 Primary dysmenorrhea: Secondary | ICD-10-CM

## 2023-03-23 MED ORDER — IBUPROFEN 800 MG PO TABS
800.0000 mg | ORAL_TABLET | Freq: Three times a day (TID) | ORAL | 1 refills | Status: DC | PRN
Start: 2023-03-23 — End: 2023-09-21
  Filled 2023-03-23: qty 60, 20d supply, fill #0

## 2023-03-23 MED ORDER — LEVOTHYROXINE SODIUM 88 MCG PO TABS
88.0000 ug | ORAL_TABLET | Freq: Every day | ORAL | 0 refills | Status: DC
  Filled 2023-03-23: qty 90, 90d supply, fill #0

## 2023-03-23 MED ORDER — LEVOTHYROXINE SODIUM 88 MCG PO TABS
88.0000 ug | ORAL_TABLET | Freq: Every day | ORAL | 1 refills | Status: DC
  Filled 2023-03-23 – 2023-04-06 (×3): qty 90, 90d supply, fill #0
  Filled 2023-07-08: qty 90, 90d supply, fill #1

## 2023-03-23 NOTE — Progress Notes (Signed)
Assessment & Plan:  Stacie Pratt was seen today for medical management of chronic issues.  Diagnoses and all orders for this visit:  Primary dysmenorrhea -     ibuprofen (ADVIL) 800 MG tablet; Take 1 tablet (800 mg total) by mouth every 8 (eight) hours as needed (menstrual cramping).  Acquired hypothyroidism -     Discontinue: levothyroxine (SYNTHROID) 88 MCG tablet; Take 1 tablet (88 mcg total) by mouth daily. -     levothyroxine (SYNTHROID) 88 MCG tablet; Take 1 tablet (88 mcg total) by mouth daily.  Encounter for immunization -     Flu vaccine trivalent PF, 6mos and older(Flulaval,Afluria,Fluarix,Fluzone) -     Tdap vaccine greater than or equal to 7yo IM    Patient has been counseled on age-appropriate routine health concerns for screening and prevention. These are reviewed and up-to-date. Referrals have been placed accordingly. Immunizations are up-to-date or declined.    Subjective:   Chief Complaint  Patient presents with   Medical Management of Chronic Issues    Stacie Pratt 47 y.o. female presents to office today with concerns of dysmenorrhea  VRI was used to communicate directly with patient for the entire encounter including providing detailed patient instructions.     [name] reports an onset of very painful menstrual cramping last month with her menstrual cycle..  States she has cramping in general with her menstrual cycles but last month's episode was more painful than any other episodes.  Her.'s are regular and occurring monthly.  She is currently on her menstrual cycle but reports no heavy cramping late last month.  There were also no other associated symptoms with the cramping last month.      Review of Systems  Constitutional:  Negative for fever, malaise/fatigue and weight loss.  HENT: Negative.  Negative for nosebleeds.   Eyes: Negative.  Negative for blurred vision, double vision and photophobia.  Respiratory: Negative.  Negative for cough and  shortness of breath.   Cardiovascular: Negative.  Negative for chest pain, palpitations and leg swelling.  Gastrointestinal: Negative.  Negative for heartburn, nausea and vomiting.  Genitourinary:        SEE HPI  Musculoskeletal: Negative.  Negative for myalgias.  Neurological: Negative.  Negative for dizziness, focal weakness, seizures and headaches.  Psychiatric/Behavioral: Negative.  Negative for suicidal ideas.     Past Medical History:  Diagnosis Date   Anxiety    Hyperlipidemia    Thyroid disease     Past Surgical History:  Procedure Laterality Date   CESAREAN SECTION     3x   TUBAL LIGATION      Family History  Problem Relation Age of Onset   Healthy Mother    Healthy Father    Diabetes Sister     Social History Reviewed with no changes to be made today.   Outpatient Medications Prior to Visit  Medication Sig Dispense Refill   cetirizine (ZYRTEC) 10 MG chewable tablet Chew 10 mg by mouth daily as needed for allergies.     Docusate Sodium (COLACE PO) Take 1 capsule by mouth daily as needed.     meloxicam (MOBIC) 15 MG tablet Take 1 tablet (15 mg total) by mouth daily. 30 tablet 3   terbinafine (LAMISIL) 250 MG tablet Take 1 tablet (250 mg total) by mouth daily. 30 tablet 0   levothyroxine (SYNTHROID) 88 MCG tablet Take 1 tablet (88 mcg total) by mouth daily. 90 tablet 0   No facility-administered medications prior to visit.  No Known Allergies     Objective:    BP 108/72 (BP Location: Left Arm, Patient Position: Sitting, Cuff Size: Normal)   Pulse 68   Ht 4' 11.5" (1.511 m)   Wt 135 lb 12.8 oz (61.6 kg)   LMP 03/15/2023   SpO2 97%   BMI 26.97 kg/m  Wt Readings from Last 3 Encounters:  03/23/23 135 lb 12.8 oz (61.6 kg)  01/14/23 136 lb (61.7 kg)  11/23/22 133 lb 9.6 oz (60.6 kg)    Physical Exam Vitals and nursing note reviewed.  Constitutional:      Appearance: She is well-developed.  HENT:     Head: Normocephalic and atraumatic.   Cardiovascular:     Rate and Rhythm: Normal rate and regular rhythm.     Heart sounds: Normal heart sounds. No murmur heard.    No friction rub. No gallop.  Pulmonary:     Effort: Pulmonary effort is normal. No tachypnea or respiratory distress.     Breath sounds: Normal breath sounds. No decreased breath sounds, wheezing, rhonchi or rales.  Chest:     Chest wall: No tenderness.  Abdominal:     General: Bowel sounds are normal.     Palpations: Abdomen is soft.  Musculoskeletal:        General: Normal range of motion.     Cervical back: Normal range of motion.  Skin:    General: Skin is warm and dry.  Neurological:     Mental Status: She is alert and oriented to person, place, and time.     Coordination: Coordination normal.  Psychiatric:        Behavior: Behavior normal. Behavior is cooperative.        Thought Content: Thought content normal.        Judgment: Judgment normal.          Patient has been counseled extensively about nutrition and exercise as well as the importance of adherence with medications and regular follow-up. The patient was given clear instructions to go to ER or return to medical center if symptoms don't improve, worsen or new problems develop. The patient verbalized understanding.   Follow-up: Return in about 6 months (around 09/20/2023).   Claiborne Rigg, FNP-BC [facility] [place], [place] [phone]   03/23/2023, 2:01 PM

## 2023-03-23 NOTE — Progress Notes (Signed)
Patient is on cycle  Painful menstrual cramping

## 2023-04-06 ENCOUNTER — Other Ambulatory Visit: Payer: Self-pay | Admitting: Podiatry

## 2023-04-06 ENCOUNTER — Other Ambulatory Visit: Payer: Self-pay

## 2023-04-06 MED ORDER — TERBINAFINE HCL 250 MG PO TABS
250.0000 mg | ORAL_TABLET | Freq: Every day | ORAL | 1 refills | Status: DC
  Filled 2023-04-06 (×3): qty 30, 30d supply, fill #0

## 2023-04-08 ENCOUNTER — Ambulatory Visit: Payer: No Typology Code available for payment source | Admitting: Podiatry

## 2023-05-06 ENCOUNTER — Ambulatory Visit: Payer: No Typology Code available for payment source | Admitting: Podiatry

## 2023-05-22 IMAGING — MG MM DIGITAL SCREENING BILAT W/ TOMO AND CAD
8 series · 9 of 24 positions shown · non-contrast
Comparison: Previous exam(s).

CLINICAL DATA: Screening.

EXAM:
DIGITAL SCREENING BILATERAL MAMMOGRAM WITH TOMOSYNTHESIS AND CAD
TECHNIQUE: Bilateral screening digital craniocaudal and mediolateral oblique
mammograms were obtained. Bilateral screening digital breast
tomosynthesis was performed. The images were evaluated with
computer-aided detection.

[L CC synth-2D]
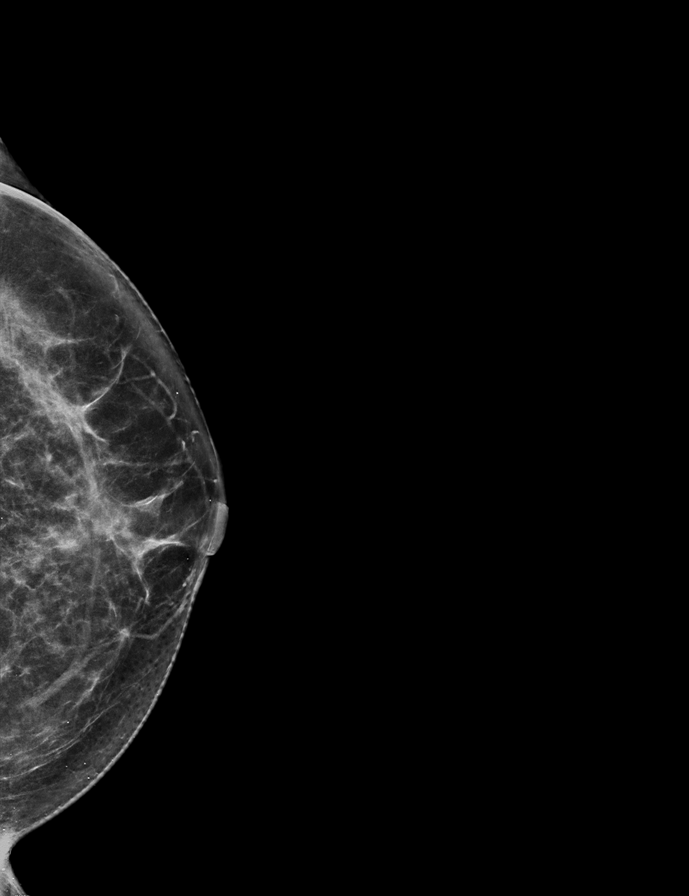

[R MLO synth-2D]
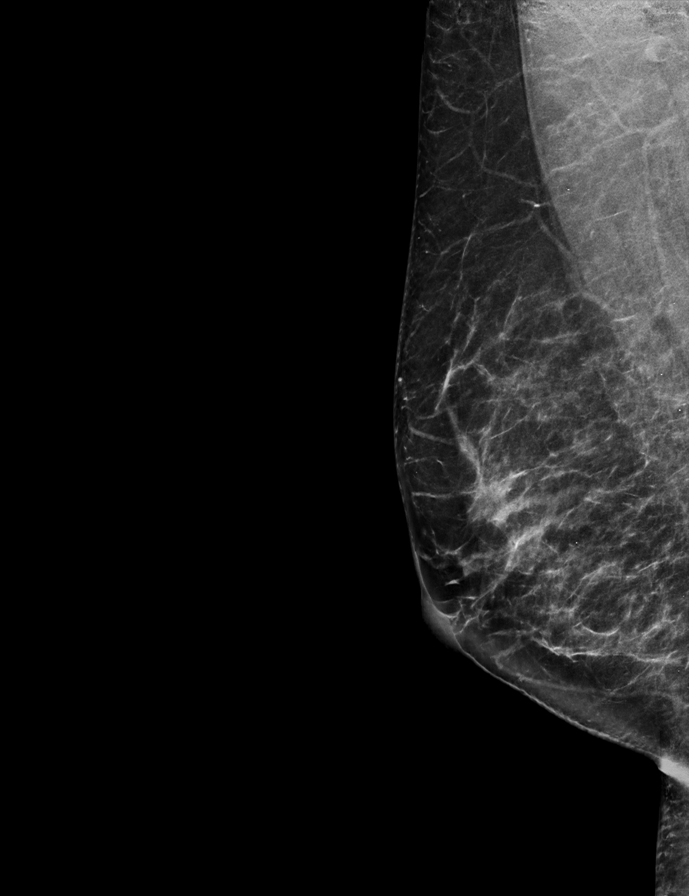

[L MLO synth-2D]
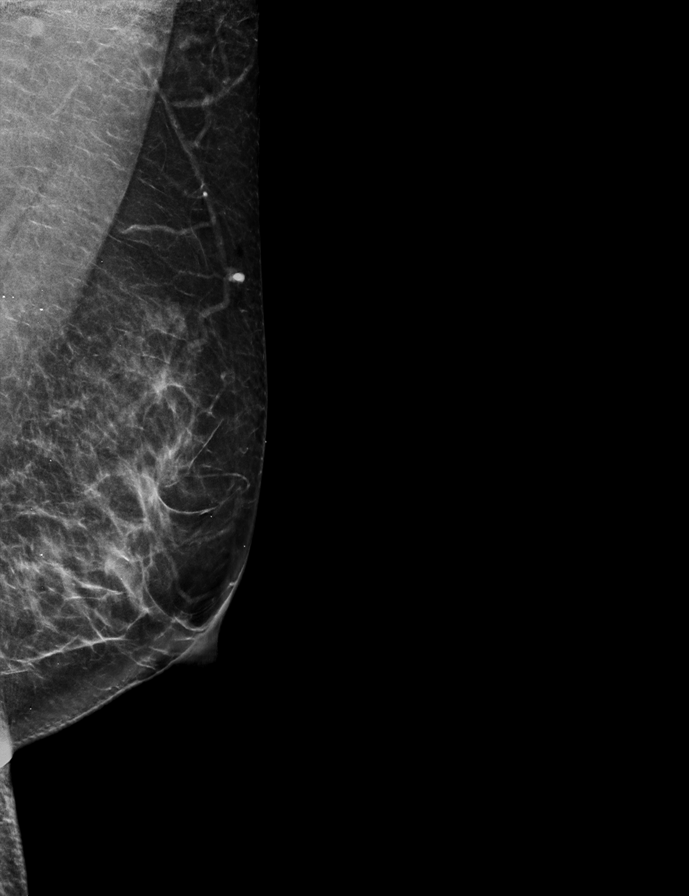

[R CC synth-2D]
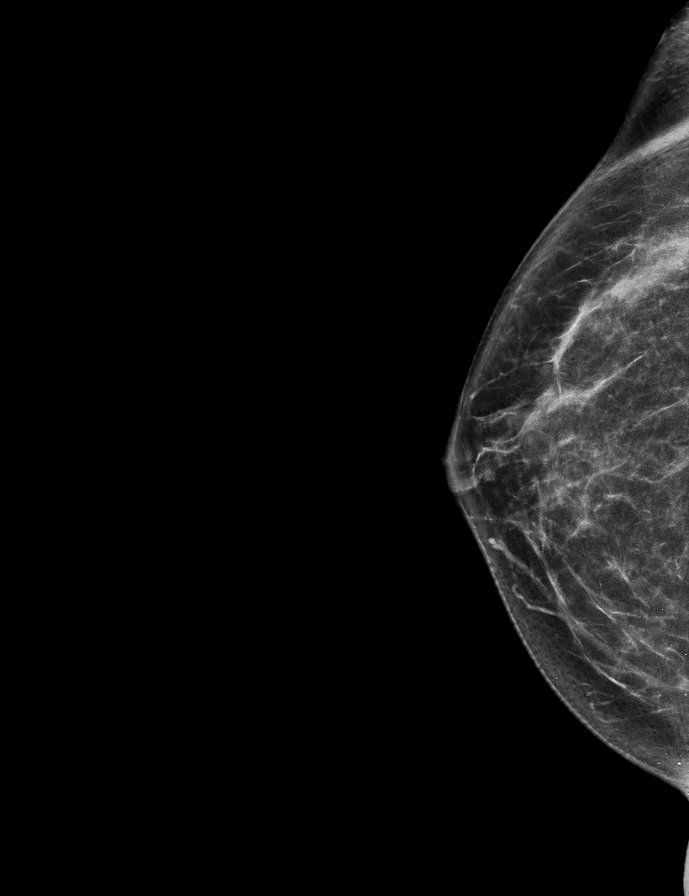

[L CC tomo · 2 of 64 frames shown]
[frame 21/64]
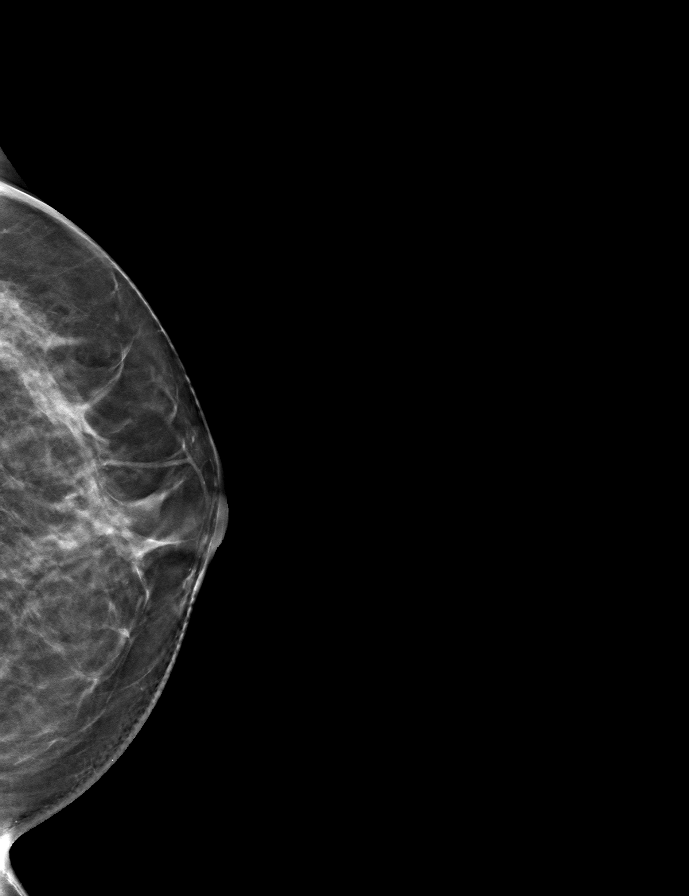
[frame 33/64]
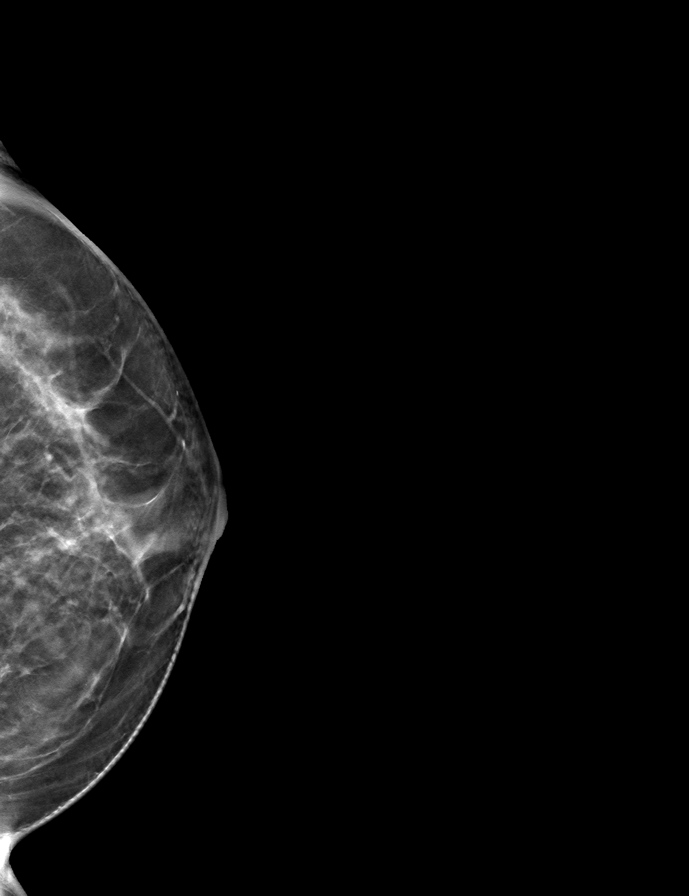

[L MLO tomo · tomo slice 34/67.0]
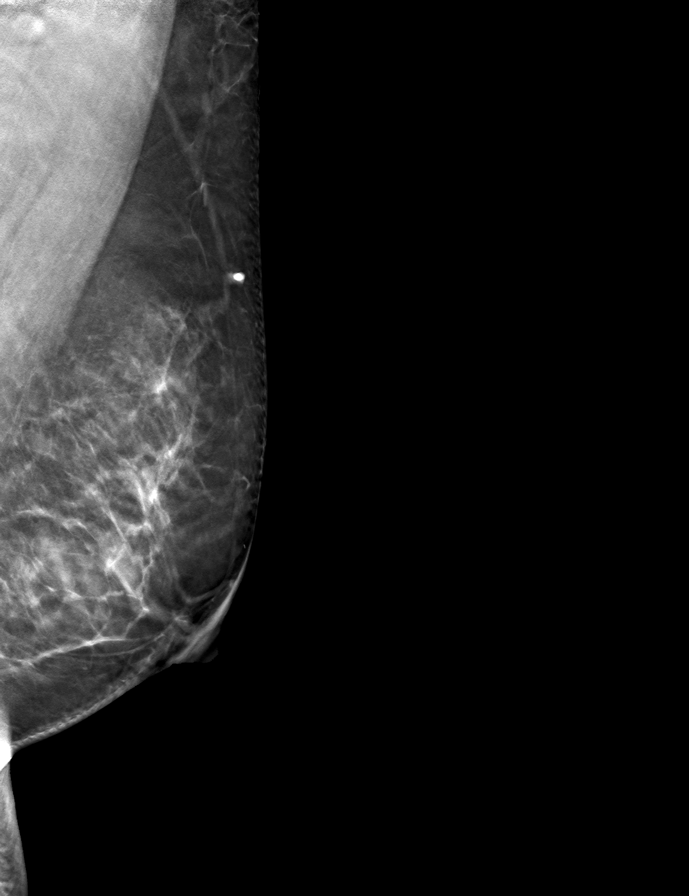

[R MLO tomo · tomo slice 33/65.0]
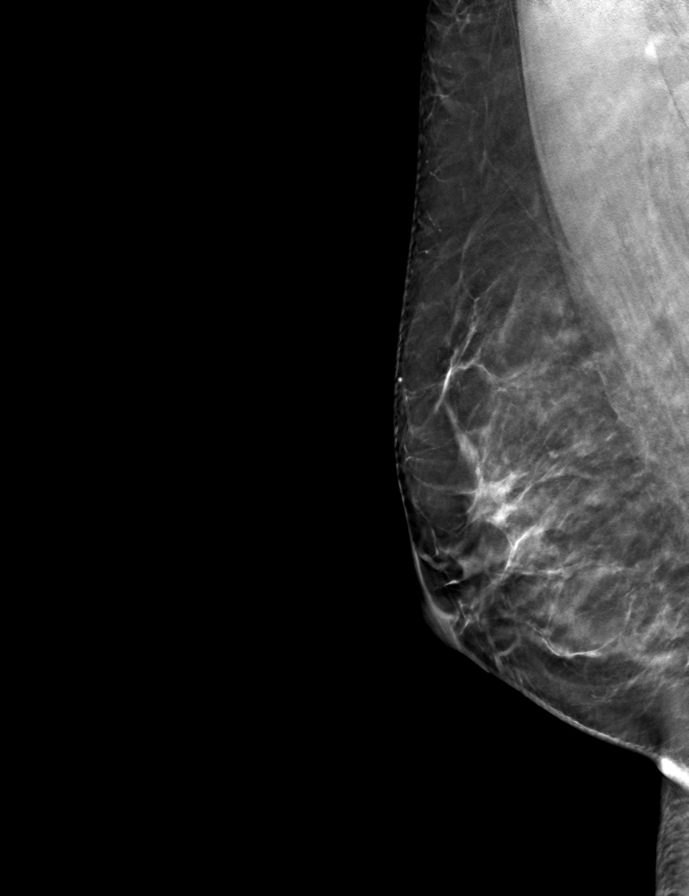

[R CC tomo · tomo slice 33/66.0]
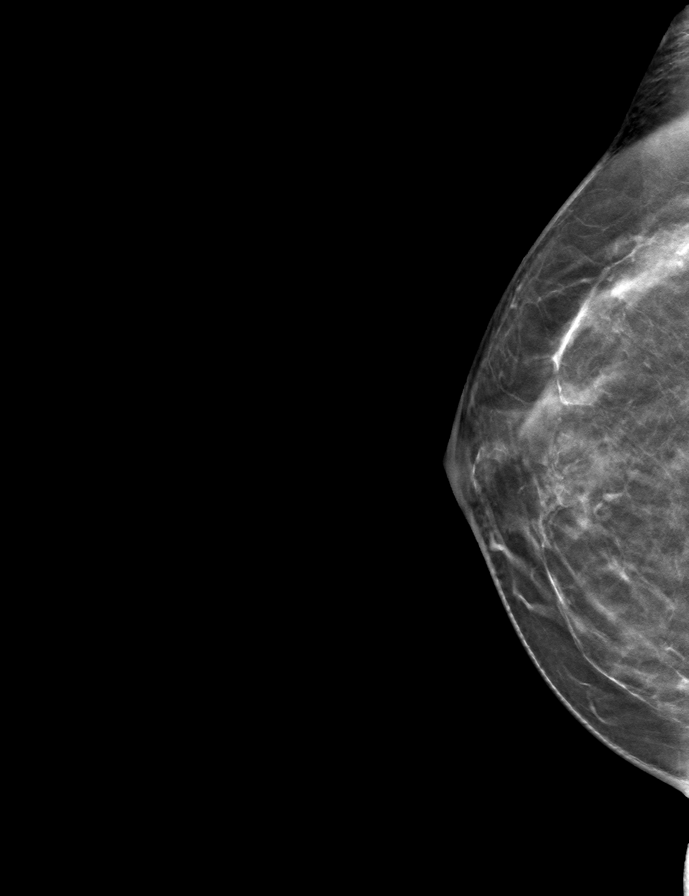

[9 of 24 positions shown; findings below may reference images not displayed]

ACR Breast Density Category b: There are scattered areas of
fibroglandular density.
FINDINGS: There are no findings suspicious for malignancy.
IMPRESSION: No mammographic evidence of malignancy. A result letter of this
screening mammogram will be mailed directly to the patient.

RECOMMENDATION:
Screening mammogram in one year. (Code:51-O-LD2)

BI-RADS CATEGORY  1: Negative.

## 2023-05-25 ENCOUNTER — Other Ambulatory Visit: Payer: Self-pay

## 2023-05-25 ENCOUNTER — Ambulatory Visit: Payer: No Typology Code available for payment source | Admitting: Podiatry

## 2023-05-25 ENCOUNTER — Encounter: Payer: Self-pay | Admitting: Podiatry

## 2023-05-25 ENCOUNTER — Other Ambulatory Visit (HOSPITAL_COMMUNITY)
Admission: AD | Admit: 2023-05-25 | Discharge: 2023-05-25 | Disposition: A | Discharge status: Home / Self Care | Payer: Self-pay | Source: Ambulatory Visit | Attending: Podiatry | Admitting: Podiatry

## 2023-05-25 DIAGNOSIS — Z79899 Other long term (current) drug therapy: Secondary | ICD-10-CM | POA: Insufficient documentation

## 2023-05-25 DIAGNOSIS — S93691A Other sprain of right foot, initial encounter: Secondary | ICD-10-CM

## 2023-05-25 DIAGNOSIS — L603 Nail dystrophy: Secondary | ICD-10-CM

## 2023-05-25 LAB — COMPREHENSIVE METABOLIC PANEL
ALT: 22 U/L (ref 0–44)
AST: 23 U/L (ref 15–41)
Albumin: 3.9 g/dL (ref 3.5–5.0)
Alkaline Phosphatase: 37 U/L — ABNORMAL LOW (ref 38–126)
Anion gap: 8 (ref 5–15)
BUN: 10 mg/dL (ref 6–20)
CO2: 22 mmol/L (ref 22–32)
Calcium: 9.1 mg/dL (ref 8.9–10.3)
Chloride: 105 mmol/L (ref 98–111)
Creatinine, Ser: 0.68 mg/dL (ref 0.44–1.00)
GFR, Estimated: >60 mL/min (ref >60)
Glucose, Bld: 100 mg/dL — ABNORMAL HIGH (ref 70–99)
Potassium: 4.3 mmol/L (ref 3.5–5.1)
Sodium: 135 mmol/L (ref 135–145)
Total Bilirubin: 0.5 mg/dL (ref 0.0–1.2)
Total Protein: 7 g/dL (ref 6.5–8.1)

## 2023-05-25 MED ORDER — TRIAMCINOLONE ACETONIDE 40 MG/ML IJ SUSP
20.0000 mg | Freq: Once | INTRAMUSCULAR | Status: AC
  Administered 2023-05-25: 20 mg

## 2023-05-25 MED ORDER — MELOXICAM 15 MG PO TABS
15.0000 mg | ORAL_TABLET | Freq: Every day | ORAL | 3 refills | Status: AC
Start: 2023-05-25 — End: ?
  Filled 2023-05-25: qty 30, 30d supply, fill #0
  Filled 2023-06-21: qty 30, 30d supply, fill #1
  Filled 2023-09-13: qty 30, 30d supply, fill #2
  Filled 2023-10-27: qty 30, 30d supply, fill #3

## 2023-05-25 MED ORDER — TERBINAFINE HCL 250 MG PO TABS
250.0000 mg | ORAL_TABLET | Freq: Every day | ORAL | 0 refills | Status: AC
  Filled 2023-05-25: qty 90, 90d supply, fill #0

## 2023-05-26 NOTE — Progress Notes (Signed)
She presents today with her interpreter stated that she has completed her first 30 days of Lamisil as she is done well with it denying fever chills nausea vomit muscle aches and pains.  She is also complaining of continued pain to the plantar fascial area in the arch that has started radiating pain up to the medial aspect of the right leg.  Objective: Vital signs are stable she is alert oriented x 3 nails are unchanged as of yet.  She still has significant pain on palpation medial calcaneal tubercle appears to be avoided in the plantar fascia as it inserts near the tubercle.  Most likely a tear.  This would be 1 reason that is not improving with current therapies.  Assessment: Probable tear of the medial band of the plantar fascia.  Chronic pain in the plantar fascia starting to result in compensatory changes.  Onychomycosis being treated long-term therapy with Lamisil.  Plan: We are requesting blood work either from her primary care provider or [facility].  We are also going to request an MRI of the right lower extremity rear foot for follow-up of Planter fasciitis.  I do suspect a plantar fascial tear this would result in a plantar fasciotomy.

## 2023-06-12 ENCOUNTER — Ambulatory Visit
Admission: RE | Admit: 2023-06-12 | Discharge: 2023-06-12 | Disposition: A | Discharge status: Home / Self Care | Payer: Self-pay | Source: Ambulatory Visit | Attending: Podiatry | Admitting: Podiatry

## 2023-06-12 DIAGNOSIS — S93691A Other sprain of right foot, initial encounter: Secondary | ICD-10-CM

## 2023-06-21 ENCOUNTER — Other Ambulatory Visit: Payer: Self-pay

## 2023-07-08 ENCOUNTER — Ambulatory Visit (INDEPENDENT_AMBULATORY_CARE_PROVIDER_SITE_OTHER): Admitting: Podiatry

## 2023-07-08 ENCOUNTER — Encounter: Payer: Self-pay | Admitting: Podiatry

## 2023-07-08 ENCOUNTER — Other Ambulatory Visit: Payer: Self-pay

## 2023-07-08 DIAGNOSIS — M722 Plantar fascial fibromatosis: Secondary | ICD-10-CM

## 2023-07-08 NOTE — Progress Notes (Signed)
 She presents today with her interpreter regarding her right plantar fascia.  She states that is better than it was but is still quite painful.  She would like for [place] to go over the MRI with her.  Objective: Vital signs are stable alert oriented x 3 pulses are palpable.  She still has some tenderness on palpation medial calcaneal tubercle of the right foot.  MRI results do demonstrate what appears to be a possible tear of the peroneus brevis her plantar fascia does not demonstrate any type of pathology other than mild inflammation.  She is nontender and not swollen overlying the peroneus brevis tendon.  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  Plan: At this point we will refer her to [facility] physical therapy and I will follow-up with her once she has completed this.

## 2023-07-15 ENCOUNTER — Ambulatory Visit: Payer: Self-pay | Admitting: Physical Therapy

## 2023-07-28 ENCOUNTER — Ambulatory Visit: Payer: Self-pay | Attending: Podiatry | Admitting: Physical Therapy

## 2023-07-28 ENCOUNTER — Encounter: Payer: Self-pay | Admitting: Physical Therapy

## 2023-07-28 DIAGNOSIS — R262 Difficulty in walking, not elsewhere classified: Secondary | ICD-10-CM

## 2023-07-28 DIAGNOSIS — M79671 Pain in right foot: Secondary | ICD-10-CM

## 2023-07-28 DIAGNOSIS — M722 Plantar fascial fibromatosis: Secondary | ICD-10-CM | POA: Insufficient documentation

## 2023-07-28 NOTE — Therapy (Signed)
 OUTPATIENT PHYSICAL THERAPY LOWER EXTREMITY EVALUATION   Patient Name: Stacie Pratt MRN: [id number] DOB:Feb 16, 1976, 48 y.o., female Today's Date: 07/28/2023  END OF SESSION:  PT End of Session - 07/28/23 1115     Visit Number 1    Number of Visits 8    Date for PT Re-Evaluation 09/22/23    Authorization Type [facility]    PT Start Time 1108    PT Stop Time 1158    PT Time Calculation (min) 50 min    Activity Tolerance Patient tolerated treatment well    Behavior During Therapy WFL for tasks assessed/performed             Past Medical History:  Diagnosis Date   Anxiety    Hyperlipidemia    Thyroid disease    Past Surgical History:  Procedure Laterality Date   CESAREAN SECTION     3x   TUBAL LIGATION     Patient Active Problem List   Diagnosis Date Noted   Herpes zoster without complication 02/02/2020    PCP: Bertram Denver, NP  REFERRING PROVIDER: Elinor Parkinson, DPM  REFERRING DIAG:  M72.2 (ICD-10-CM) - Plantar fasciitis of right foot    THERAPY DIAG:  Pain of right heel  Difficulty in walking, not elsewhere classified  Rationale for Evaluation and Treatment: Rehabilitation  ONSET DATE: 10 + mos   SUBJECTIVE:   SUBJECTIVE STATEMENT: Patient presents with chronic plantar fasciitis, referred from Dr. Ernestene Kiel with issues going on for over 10 months.  She had an MRI which showed no tear. She was told about a yr ago they saw a tear but the most recent MD did not find it.  She has difficulty with walking, standing and stretching it is painful. She does do an exercise video at home with jumping but she has pain with this.  Walking faster or turning makes it worse.  She is limited to standing for about an hour, pain goes up to 5/10.  Pain with crossing her leg over (at ankles)  She has tried injections and that helped some.  She would like to be able to reduce pain while walking.   PERTINENT HISTORY:  None   PAIN:  Are you having pain? Yes: NPRS  scale: 5/10 when walking  Pain location: Rt medial heel  Pain description: swollen, sharp Aggravating factors: Walking  Relieving factors: injection, meds don't help much  PRECAUTIONS: None  RED FLAGS: None   WEIGHT BEARING RESTRICTIONS: No  FALLS:  Has patient fallen in last 6 months? No  LIVING ENVIRONMENT: Lives with: lives with their family Lives in: House/apartment Stairs: No Has following equipment at home: None  OCCUPATION: not working   PLOF: Independent  PATIENT GOALS: pain relief   NEXT MD VISIT: as needed   OBJECTIVE:  Note: Objective measures were completed at Evaluation unless otherwise noted.  DIAGNOSTIC FINDINGS: Moderate tendinosis of the peroneus brevis with a partial-thickness tear just distal to the lateral malleolus.  PATIENT SURVEYS:  LEFS 44/80  COGNITION: Overall cognitive status: Within functional limits for tasks assessed     SENSATION: WFL  EDEMA:  Figure 8: 18. 5 inches  Puffiness in Rt heel noted  MUSCLE LENGTH: Hamstrings:tight  Thomas test: NT   POSTURE:  knees hyperextend, lacks arch support, pes planus   PALPATION: TTP pain on heel toward medial aspect , pocket of swelling   LOWER EXTREMITY ROM:  Active ROM Right eval Left eval  Hip flexion    Hip extension  Hip abduction    Hip adduction    Hip internal rotation    Hip external rotation    Knee flexion    Knee extension    Ankle dorsiflexion 10   Ankle plantarflexion 45   Ankle inversion 45   Ankle eversion 11    (Blank rows = not tested)  LOWER EXTREMITY MMT:  MMT Right eval Left eval  Hip flexion    Hip extension    Hip abduction    Hip adduction    Hip internal rotation    Hip external rotation    Knee flexion    Knee extension    Ankle dorsiflexion 5   Ankle plantarflexion 5   Ankle inversion 5   Ankle eversion 5    (Blank rows = not tested)  LOWER EXTREMITY SPECIAL TESTS:    FUNCTIONAL TESTS:  5 times sit to stand: NT  30  seconds chair stand test 2 minute walk test: NT  SLS more difficult on Rt LE   GAIT: Distance walked: 100  Assistive device utilized: None Level of assistance: Complete Independence Comments: did not assess due to time                                                                                                                                 TREATMENT DATE:  [facility] Adult PT Treatment:                                                DATE: 07/28/23 Therapeutic Activity: Hamstring/ITB Wall stretch for calf Verbal review of SLS and HEP  Self Care: See below pt. Ed.      PATIENT EDUCATION:  Education details: PT/POC, dry needling, HEP, ice, orthotics, foot position  Person educated: Patient Education method: Explanation, Demonstration, and Handouts Education comprehension: verbalized understanding, returned demonstration, and needs further education  HOME EXERCISE PROGRAM: Access Code: [email or web address] URL: [email or web address] Date: 07/28/2023 Prepared by: Karie Mainland  Exercises - Single Leg Stance with Support  - 1 x daily - 7 x weekly - 1 sets - 5 reps - 30 hold - Gastroc Stretch on Wall  - 1 x daily - 7 x weekly - 1 sets - 3 reps - 30 hold - Soleus Stretch on Wall  - 1 x daily - 7 x weekly - 1 sets - 3 reps - 30 hold - Standing Heel Raise with Support  - 1 x daily - 7 x weekly - 2 sets - 10 reps - 5 hold - Supine Hamstring Stretch with Strap  - 1 x daily - 7 x weekly - 1 sets - 3 reps - 30 hold  ASSESSMENT:  CLINICAL IMPRESSION: Patient is a 48 y.o. female who was seen today for physical therapy evaluation and treatment for Rt heel pain.  Her  pain is all in her heel, but does recall a time last year when she had to walk a great deal and did have some swelling in her Rt ankle.  MRI confirmed a partial tear in peroneus brevis but no longer has lateral foot/ankle pain.    OBJECTIVE IMPAIRMENTS: decreased balance, decreased mobility, difficulty walking, increased  fascial restrictions, increased muscle spasms, and pain.   ACTIVITY LIMITATIONS: standing and locomotion level  PARTICIPATION LIMITATIONS: community activity  PERSONAL FACTORS: Time since onset of injury/illness/exacerbation are also affecting patient's functional outcome.   REHAB POTENTIAL: Excellent  CLINICAL DECISION MAKING: Evolving/moderate complexity  EVALUATION COMPLEXITY: Moderate   GOALS: Goals reviewed with patient? Yes  SHORT TERM GOALS: Target date: 08/11/2023   Patient will be independent with home exercise program for ankle and foot Baseline: HEP given  Goal status: INITIAL  2.  Patient will obtain custom orthotics or over-the-counter orthotics in order to improve foot position Baseline: will bring in  Goal status: INITIAL  3.  Patient will be independent with concept of anti-inflammation/self-care tips Baseline: needs cues  Goal status: INITIAL   LONG TERM GOALS: Target date: 09/08/2023   Patient will be able to have independence with Home exercise program Baseline: Unknown given on evaluation Goal status: INITIAL  2.  Patient will be able to report pain in the right foot is no more than min with walking fast. Baseline: mod to severe  Goal status: INITIAL  3.  Patient will be able to ambulate in the community as needed with pain no more than 3/10 in Rt heel  Baseline: pain moderate 5/10-6/10 Goal status: INITIAL  4.  Patient will demonstrate single-leg balance on Rt LE  for min dynamic activities  Baseline: static, 15 sec  Goal status: INITIAL  5.  Patient will improve LEFS score to 67/80 Baseline: 55/80 Goal status: INITIAL    PLAN:  PT FREQUENCY: 1-2x/week  PT DURATION: 6- 8 weeks  PLANNED INTERVENTIONS: 97164- PT Re-evaluation, 97110-Therapeutic exercises, 97530- Therapeutic activity, 97112- Neuromuscular re-education, 97535- Self Care, 16109- Manual therapy, Patient/Family education, Balance training, Taping, Dry Needling, Joint  mobilization, Cryotherapy, and Moist heat  PLAN FOR NEXT SESSION: check HEP, manual/DN, standing as tolerated for ankle proprioception and foot intrinsics   Ashle Stief, PT 07/28/2023, 12:13 PM   Karie Mainland, PT 07/28/23 12:14 PM Phone: [phone] Fax: [phone]

## 2023-08-02 ENCOUNTER — Ambulatory Visit: Payer: Self-pay | Admitting: Physical Therapy

## 2023-08-03 NOTE — Therapy (Unsigned)
 OUTPATIENT PHYSICAL THERAPY LOWER EXTREMITY NOTE    Patient Name: Stacie Pratt MRN: [id number] DOB:15-May-1975, 48 y.o., female Today's Date: 08/04/2023  END OF SESSION:  PT End of Session - 08/04/23 1019     Visit Number 2    Number of Visits 8    Date for PT Re-Evaluation 09/22/23    Authorization Type [facility]    PT Start Time 1019    PT Stop Time 1100    PT Time Calculation (min) 41 min    Activity Tolerance Patient tolerated treatment well    Behavior During Therapy WFL for tasks assessed/performed              Past Medical History:  Diagnosis Date   Anxiety    Hyperlipidemia    Thyroid disease    Past Surgical History:  Procedure Laterality Date   CESAREAN SECTION     3x   TUBAL LIGATION     Patient Active Problem List   Diagnosis Date Noted   Herpes zoster without complication 02/02/2020    PCP: Bertram Denver, NP  REFERRING PROVIDER: Elinor Parkinson, DPM  REFERRING DIAG:  M72.2 (ICD-10-CM) - Plantar fasciitis of right foot    THERAPY DIAG:  Pain of right heel  Difficulty in walking, not elsewhere classified  Rationale for Evaluation and Treatment: Rehabilitation  ONSET DATE: 10 + mos   SUBJECTIVE:   SUBJECTIVE STATEMENT:   Pt found another pair of shoes that help her feel better.    Patient presents with chronic plantar fasciitis, referred from Dr. Ernestene Kiel with issues going on for over 10 months.  She had an MRI which showed no tear. She was told about a yr ago they saw a tear but the most recent MD did not find it.  She has difficulty with walking, standing and stretching it is painful. She does do an exercise video at home with jumping but she has pain with this.  Walking faster or turning makes it worse.  She is limited to standing for about an hour, pain goes up to 5/10.  Pain with crossing her leg over (at ankles)  She has tried injections and that helped some.  She would like to be able to reduce pain while walking.   PERTINENT  HISTORY:  None   PAIN:  Are you having pain? Yes: NPRS scale: none current 0/10.  5/10 when walking  Pain location: Rt medial heel  Pain description: swollen, sharp Aggravating factors: Walking  Relieving factors: injection, meds don't help much  PRECAUTIONS: None  RED FLAGS: None   WEIGHT BEARING RESTRICTIONS: No  FALLS:  Has patient fallen in last 6 months? No  LIVING ENVIRONMENT: Lives with: lives with their family Lives in: House/apartment Stairs: No Has following equipment at home: None  OCCUPATION: not working   PLOF: Independent  PATIENT GOALS: pain relief   NEXT MD VISIT: as needed   OBJECTIVE:  Note: Objective measures were completed at Evaluation unless otherwise noted.  DIAGNOSTIC FINDINGS: Moderate tendinosis of the peroneus brevis with a partial-thickness tear just distal to the lateral malleolus.  PATIENT SURVEYS:  LEFS 44/80  COGNITION: Overall cognitive status: Within functional limits for tasks assessed     SENSATION: WFL  EDEMA:  Figure 8: 18. 5 inches  Puffiness in Rt heel noted  MUSCLE LENGTH: Hamstrings:tight  Thomas test: NT   POSTURE:  knees hyperextend, lacks arch support, pes planus   PALPATION: TTP pain on heel toward medial aspect , pocket of  swelling   LOWER EXTREMITY ROM:  Active ROM Right eval Left eval  Hip flexion    Hip extension    Hip abduction    Hip adduction    Hip internal rotation    Hip external rotation    Knee flexion    Knee extension    Ankle dorsiflexion 10   Ankle plantarflexion 45   Ankle inversion 45   Ankle eversion 11    (Blank rows = not tested)  LOWER EXTREMITY MMT:  MMT Right eval Left eval  Hip flexion    Hip extension    Hip abduction    Hip adduction    Hip internal rotation    Hip external rotation    Knee flexion    Knee extension    Ankle dorsiflexion 5   Ankle plantarflexion 5   Ankle inversion 5   Ankle eversion 5    (Blank rows = not tested)  LOWER  EXTREMITY SPECIAL TESTS:    FUNCTIONAL TESTS:  5 times sit to stand: NT  30 seconds chair stand test 2 minute walk test: NT  SLS more difficult on Rt LE   GAIT: Distance walked: 100  Assistive device utilized: None Level of assistance: Complete Independence Comments: did not assess due to time                                                                                                                                 TREATMENT DATE:   [facility] Adult PT Treatment:                                                DATE: 08/04/23  Therapeutic Activity: 2 min walk test  495 feet, pain increased to 8/10 Standing calf stretch (gastroc/soleus)  Standing heel raises 3 x 15 in varied positions  Hamstring calf with towel x 30 sec bilateral x 2  Eversion and inversion 2 ways GTB added to HEP  Single leg balance on Airex pad with 1 UE support several trials to maintain balance to 30 sec  Self Care: Discussed insoles and foot support in her new shoes.     [facility] Adult PT Treatment:                                                DATE: 07/28/23 Therapeutic Activity: Hamstring/ITB Wall stretch for calf Verbal review of SLS and HEP  Self Care: See below pt. Ed.      PATIENT EDUCATION:  Education details: PT/POC, dry needling, HEP, ice, orthotics, foot position  Person educated: Patient Education method: Explanation, Demonstration, and Handouts Education comprehension: verbalized understanding, returned demonstration, and needs further education  HOME EXERCISE PROGRAM: Access Code: [email or web address] URL: [email or web address] Date: 07/28/2023 Prepared by: Karie Mainland  Exercises - Single Leg Stance with Support  - 1 x daily - 7 x weekly - 1 sets - 5 reps - 30 hold - Gastroc Stretch on Wall  - 1 x daily - 7 x weekly - 1 sets - 3 reps - 30 hold - Soleus Stretch on Wall  - 1 x daily - 7 x weekly - 1 sets - 3 reps - 30 hold - Standing Heel Raise with Support  - 1 x daily - 7 x weekly - 2  sets - 10 reps - 5 hold - Supine Hamstring Stretch with Strap  - 1 x daily - 7 x weekly - 1 sets - 3 reps - 30 hold  ASSESSMENT:  CLINICAL IMPRESSION: Patient with recent purchase of new shoes that have helped her foot. She was educated on the option for adding one of her off the shelf insoles to her shoes if needed.  Added some banded exercises for eversion and inversion, she found eversion more difficult. She will cont to benefit from skilled PT to further improve her foot support and provide stability/proprioceptive training as well.     Patient is a 48 y.o. female who was seen today for physical therapy evaluation and treatment for Rt heel pain.  Her pain is all in her heel, but does recall a time last year when she had to walk a great deal and did have some swelling in her Rt ankle.  MRI confirmed a partial tear in peroneus brevis but no longer has lateral foot/ankle pain.    OBJECTIVE IMPAIRMENTS: decreased balance, decreased mobility, difficulty walking, increased fascial restrictions, increased muscle spasms, and pain.   ACTIVITY LIMITATIONS: standing and locomotion level  PARTICIPATION LIMITATIONS: community activity  PERSONAL FACTORS: Time since onset of injury/illness/exacerbation are also affecting patient's functional outcome.   REHAB POTENTIAL: Excellent  CLINICAL DECISION MAKING: Evolving/moderate complexity  EVALUATION COMPLEXITY: Moderate   GOALS: Goals reviewed with patient? Yes  SHORT TERM GOALS: Target date: 08/11/2023   Patient will be independent with home exercise program for ankle and foot Baseline: HEP given  Goal status: ongoing   2.  Patient will obtain custom orthotics or over-the-counter orthotics in order to improve foot position Baseline: will bring in  Goal status:ongoing   3.  Patient will be independent with concept of anti-inflammation/self-care tips Baseline: needs cues  Goal status: INITIAL   LONG TERM GOALS: Target date:  09/08/2023   Patient will be able to have independence with Home exercise program Baseline: Unknown given on evaluation Goal status: INITIAL  2.  Patient will be able to report pain in the right foot is no more than min with walking fast. Baseline: mod to severe  Goal status: INITIAL  3.  Patient will be able to ambulate in the community as needed with pain no more than 3/10 in Rt heel  Baseline: pain moderate 5/10-6/10 Goal status: INITIAL  4.  Patient will demonstrate single-leg balance on Rt LE  for min dynamic activities  Baseline: static, 15 sec  Goal status: INITIAL  5.  Patient will improve LEFS score to 67/80 Baseline: 55/80 Goal status: INITIAL    PLAN:  PT FREQUENCY: 1-2x/week  PT DURATION: 6- 8 weeks  PLANNED INTERVENTIONS: 97164- PT Re-evaluation, 97110-Therapeutic exercises, 97530- Therapeutic activity, 97112- Neuromuscular re-education, 97535- Self Care, 16109- Manual therapy, Patient/Family education, Balance training, Taping, Dry Needling, Joint mobilization, Cryotherapy, and Moist heat  PLAN  FOR NEXT SESSION: check HEP, manual/DN, standing as tolerated for ankle proprioception and foot intrinsics   Raidyn Breiner, PT 08/04/2023, 11:04 AM   Karie Mainland, PT 08/04/23 11:04 AM Phone: [phone] Fax: [phone]

## 2023-08-04 ENCOUNTER — Telehealth: Payer: Self-pay

## 2023-08-04 ENCOUNTER — Encounter: Payer: Self-pay | Admitting: Physical Therapy

## 2023-08-04 ENCOUNTER — Other Ambulatory Visit: Payer: Self-pay | Admitting: Nurse Practitioner

## 2023-08-04 ENCOUNTER — Ambulatory Visit: Payer: Self-pay | Admitting: Physical Therapy

## 2023-08-04 ENCOUNTER — Other Ambulatory Visit: Payer: Self-pay

## 2023-08-04 DIAGNOSIS — E039 Hypothyroidism, unspecified: Secondary | ICD-10-CM

## 2023-08-04 DIAGNOSIS — R262 Difficulty in walking, not elsewhere classified: Secondary | ICD-10-CM

## 2023-08-04 DIAGNOSIS — M79671 Pain in right foot: Secondary | ICD-10-CM

## 2023-08-04 MED ORDER — LEVOTHYROXINE SODIUM 88 MCG PO TABS
88.0000 ug | ORAL_TABLET | Freq: Every day | ORAL | 1 refills | Status: DC
  Filled 2023-08-04: qty 30, 30d supply, fill #0
  Filled 2023-10-27: qty 30, 30d supply, fill #1
  Filled 2023-11-29 (×2): qty 30, 30d supply, fill #2
  Filled 2023-12-01: qty 90, 90d supply, fill #2

## 2023-08-04 NOTE — Telephone Encounter (Signed)
 Patient came in stating she lost her prescription bottle of levothyroxine and has missed 2 days , patient is requesting a refill until her appointment if possible.

## 2023-08-10 NOTE — Therapy (Signed)
 OUTPATIENT PHYSICAL THERAPY LOWER EXTREMITY NOTE    Patient Name: Stacie Pratt MRN: [id number] DOB:17-Apr-1976, 48 y.o., female Today's Date: 08/11/2023  END OF SESSION:  PT End of Session - 08/11/23 1149     Visit Number 3    Number of Visits 8    Date for PT Re-Evaluation 09/22/23    Authorization Type [facility]    PT Start Time 1149    PT Stop Time 1230    PT Time Calculation (min) 41 min    Activity Tolerance Patient tolerated treatment well    Behavior During Therapy WFL for tasks assessed/performed               Past Medical History:  Diagnosis Date   Anxiety    Hyperlipidemia    Thyroid disease    Past Surgical History:  Procedure Laterality Date   CESAREAN SECTION     3x   TUBAL LIGATION     Patient Active Problem List   Diagnosis Date Noted   Herpes zoster without complication 02/02/2020    PCP: Bertram Denver, NP  REFERRING PROVIDER: Elinor Parkinson, DPM  REFERRING DIAG:  M72.2 (ICD-10-CM) - Plantar fasciitis of right foot    THERAPY DIAG:  Pain of right heel  Difficulty in walking, not elsewhere classified  Rationale for Evaluation and Treatment: Rehabilitation  ONSET DATE: 10 + mos   SUBJECTIVE:   SUBJECTIVE STATEMENT: Today, her R heel feels good. Yesterday she had pain.   Patient presents with chronic plantar fasciitis, referred from Dr. Ernestene Kiel with issues going on for over 10 months.  She had an MRI which showed no tear. She was told about a yr ago they saw a tear but the most recent MD did not find it.  She has difficulty with walking, standing and stretching it is painful. She does do an exercise video at home with jumping but she has pain with this.  Walking faster or turning makes it worse.  She is limited to standing for about an hour, pain goes up to 5/10.  Pain with crossing her leg over (at ankles)  She has tried injections and that helped some.  She would like to be able to reduce pain while walking.   PERTINENT  HISTORY:  None   PAIN:  Are you having pain? Yes: NPRS scale: none current 0/10.  5/10 when walking  Pain location: Rt medial heel  Pain description: swollen, sharp Aggravating factors: Walking  Relieving factors: injection, meds don't help much  PRECAUTIONS: None  RED FLAGS: None   WEIGHT BEARING RESTRICTIONS: No  FALLS:  Has patient fallen in last 6 months? No  LIVING ENVIRONMENT: Lives with: lives with their family Lives in: House/apartment Stairs: No Has following equipment at home: None  OCCUPATION: not working   PLOF: Independent  PATIENT GOALS: pain relief   NEXT MD VISIT: as needed   OBJECTIVE:  Note: Objective measures were completed at Evaluation unless otherwise noted.  DIAGNOSTIC FINDINGS: Moderate tendinosis of the peroneus brevis with a partial-thickness tear just distal to the lateral malleolus.  PATIENT SURVEYS:  LEFS 44/80  COGNITION: Overall cognitive status: Within functional limits for tasks assessed     SENSATION: WFL  EDEMA:  Figure 8: 18. 5 inches  Puffiness in Rt heel noted  MUSCLE LENGTH: Hamstrings:tight  Thomas test: NT   POSTURE:  knees hyperextend, lacks arch support, pes planus   PALPATION: TTP pain on heel toward medial aspect , pocket of swelling  LOWER EXTREMITY ROM:  Active ROM Right eval Left eval  Hip flexion    Hip extension    Hip abduction    Hip adduction    Hip internal rotation    Hip external rotation    Knee flexion    Knee extension    Ankle dorsiflexion 10   Ankle plantarflexion 45   Ankle inversion 45   Ankle eversion 11    (Blank rows = not tested)  LOWER EXTREMITY MMT:  MMT Right eval Left eval  Hip flexion    Hip extension    Hip abduction    Hip adduction    Hip internal rotation    Hip external rotation    Knee flexion    Knee extension    Ankle dorsiflexion 5   Ankle plantarflexion 5   Ankle inversion 5   Ankle eversion 5    (Blank rows = not tested)  LOWER  EXTREMITY SPECIAL TESTS:    FUNCTIONAL TESTS:  5 times sit to stand: NT  30 seconds chair stand test 2 minute walk test: NT  SLS more difficult on Rt LE   GAIT: Distance walked: 100  Assistive device utilized: None Level of assistance: Complete Independence Comments: did not assess due to time                                                                                                                     TREATMENT DATE:  [facility] Adult PT Treatment:                                                DATE: 08/11/23 Therapeutic Activity: Towel scrunches 2x15  Standing calf stretch (gastroc/soleus) 30 each c rolled towel Standing heel raises on rolled towel 3 x 15  Hamstring calf with towel 2 x 30 sec Single leg balance on Airex pad with 1 UE support several trials to maintain balance to 30 sec  Manual Therapy: STM to the plantar fascia Self Care: Pt Ed for STM to the plantar fascia daily; frozen water bottle massage with flare ups; avoid walking barefoot and use best supportive shoes as much as possible; and to complete stretches daily, and strengthening exs every other day   [facility] Adult PT Treatment:                                                DATE: 08/04/23 Therapeutic Activity: 2 min walk test  495 feet, pain increased to 8/10 Standing calf stretch (gastroc/soleus)  Standing heel raises 3 x 15 in varied positions  Hamstring calf with towel x 30 sec bilateral x 2  Eversion and inversion 2 ways GTB added to HEP  Single leg balance on Airex pad with  1 UE support several trials to maintain balance to 30 sec  Self Care: Discussed insoles and foot support in her new shoes.     [facility] Adult PT Treatment:                                                DATE: 07/28/23 Therapeutic Activity: Hamstring/ITB Wall stretch for calf Verbal review of SLS and HEP  Self Care: See below pt. Ed.      PATIENT EDUCATION:  Education details: PT/POC, dry needling, HEP, ice, orthotics, foot position   Person educated: Patient Education method: Explanation, Demonstration, and Handouts Education comprehension: verbalized understanding, returned demonstration, and needs further education  HOME EXERCISE PROGRAM: Access Code: [email or web address] URL: [email or web address] Date: 08/11/2023 Prepared by: Liborio Reeds  Exercises - Single Leg Stance with Support  - 1 x daily - 7 x weekly - 1 sets - 5 reps - 30 hold - Gastroc Stretch on Wall  - 1 x daily - 7 x weekly - 1 sets - 3 reps - 30 hold - Soleus Stretch on Wall  - 1 x daily - 7 x weekly - 1 sets - 3 reps - 30 hold - Supine Hamstring Stretch with Strap  - 1 x daily - 7 x weekly - 1 sets - 3 reps - 30 hold - Standing Heel Raise with Support  - 1 x daily - 7 x weekly - 3 sets - 10 reps - 2 hold - Seated Ankle Eversion with Resistance  - 1 x daily - 7 x weekly - 2 sets - 10 reps - 5 hold - Seated Figure 4 Ankle Inversion with Resistance  - 1 x daily - 7 x weekly - 2 sets - 10 reps - 5 hold - Seated Toe Towel Scrunches  - 1 x daily - 7 x weekly - 2 sets - 15 reps - 3 hold  ASSESSMENT:  CLINICAL IMPRESSION: PT was completed for STM to the R plantar fascia f/b stretching and strengthening exercises to the R foot and ankle.Pt Ed was provided for tissue remodeling and symptom modulation. Pt tolerated PT today without adverse effects. Pt will continue to benefit from skilled PT to address impairments for improved R foot function with minimized pain.   Patient is a 48 y.o. female who was seen today for physical therapy evaluation and treatment for Rt heel pain.  Her pain is all in her heel, but does recall a time last year when she had to walk a great deal and did have some swelling in her Rt ankle.  MRI confirmed a partial tear in peroneus brevis but no longer has lateral foot/ankle pain.    OBJECTIVE IMPAIRMENTS: decreased balance, decreased mobility, difficulty walking, increased fascial restrictions, increased muscle spasms, and pain.    ACTIVITY LIMITATIONS: standing and locomotion level  PARTICIPATION LIMITATIONS: community activity  PERSONAL FACTORS: Time since onset of injury/illness/exacerbation are also affecting patient's functional outcome.   REHAB POTENTIAL: Excellent  CLINICAL DECISION MAKING: Evolving/moderate complexity  EVALUATION COMPLEXITY: Moderate   GOALS: Goals reviewed with patient? Yes  SHORT TERM GOALS: Target date: 08/11/2023   Patient will be independent with home exercise program for ankle and foot Baseline: HEP given  Goal status: ongoing   2.  Patient will obtain custom orthotics or over-the-counter orthotics in order to improve foot position Baseline: will bring in  Goal  status:ongoing   3.  Patient will be independent with concept of anti-inflammation/self-care tips Baseline: needs cues  Goal status: INITIAL   LONG TERM GOALS: Target date: 09/08/2023   Patient will be able to have independence with Home exercise program Baseline: Unknown given on evaluation Goal status: INITIAL  2.  Patient will be able to report pain in the right foot is no more than min with walking fast. Baseline: mod to severe  Goal status: INITIAL  3.  Patient will be able to ambulate in the community as needed with pain no more than 3/10 in Rt heel  Baseline: pain moderate 5/10-6/10 Goal status: INITIAL  4.  Patient will demonstrate single-leg balance on Rt LE  for min dynamic activities  Baseline: static, 15 sec  Goal status: INITIAL  5.  Patient will improve LEFS score to 67/80 Baseline: 55/80 Goal status: INITIAL    PLAN:  PT FREQUENCY: 1-2x/week  PT DURATION: 6- 8 weeks  PLANNED INTERVENTIONS: 97164- PT Re-evaluation, 97110-Therapeutic exercises, 97530- Therapeutic activity, 97112- Neuromuscular re-education, 97535- Self Care, 52841- Manual therapy, Patient/Family education, Balance training, Taping, Dry Needling, Joint mobilization, Cryotherapy, and Moist heat  PLAN FOR NEXT  SESSION: check HEP, manual/DN, standing as tolerated for ankle proprioception and foot intrinsics   Maureen Duesing, PT 08/11/2023, 1:17 PM

## 2023-08-11 ENCOUNTER — Ambulatory Visit: Payer: Self-pay

## 2023-08-11 DIAGNOSIS — M79671 Pain in right foot: Secondary | ICD-10-CM

## 2023-08-11 DIAGNOSIS — R262 Difficulty in walking, not elsewhere classified: Secondary | ICD-10-CM

## 2023-08-18 ENCOUNTER — Ambulatory Visit: Payer: Self-pay | Admitting: Physical Therapy

## 2023-08-18 ENCOUNTER — Encounter: Payer: Self-pay | Admitting: Physical Therapy

## 2023-08-18 DIAGNOSIS — R262 Difficulty in walking, not elsewhere classified: Secondary | ICD-10-CM

## 2023-08-18 DIAGNOSIS — M79671 Pain in right foot: Secondary | ICD-10-CM

## 2023-08-18 NOTE — Therapy (Signed)
 OUTPATIENT PHYSICAL THERAPY LOWER EXTREMITY NOTE    Patient Name: Stacie Pratt MRN: [id number] DOB:Aug 21, 1975, 48 y.o., female Today's Date: 08/18/2023  END OF SESSION:  PT End of Session - 08/18/23 1019     Visit Number 4    Number of Visits 8    Date for PT Re-Evaluation 09/22/23    Authorization Type [facility]    PT Start Time 1018    PT Stop Time 1100    PT Time Calculation (min) 42 min    Activity Tolerance Patient tolerated treatment well    Behavior During Therapy WFL for tasks assessed/performed                Past Medical History:  Diagnosis Date   Anxiety    Hyperlipidemia    Thyroid  disease    Past Surgical History:  Procedure Laterality Date   CESAREAN SECTION     3x   TUBAL LIGATION     Patient Active Problem List   Diagnosis Date Noted   Herpes zoster without complication 02/02/2020    PCP: Winda Hastings, NP  REFERRING PROVIDER: Clemetine Cypher, DPM  REFERRING DIAG:  M72.2 (ICD-10-CM) - Plantar fasciitis of right foot    THERAPY DIAG:  Pain of right heel  Difficulty in walking, not elsewhere classified  Rationale for Evaluation and Treatment: Rehabilitation  ONSET DATE: 10 + mos   SUBJECTIVE:   SUBJECTIVE STATEMENT: Rt medial ankle discomfort.  She has already walked this AM for 20 min and has been doing housework. The pain is worse when she starts alking and then it improves as she walks.  She is walking at a moderate pace, purposeful.  She recalls a time long ago twisting her ankle in a hole in the backyard (2 x in the past yr)  Patient presents with chronic plantar fasciitis, referred from Dr. Jinnie Mountain with issues going on for over 10 months.  She had an MRI which showed no tear. She was told about a yr ago they saw a tear but the most recent MD did not find it.  She has difficulty with walking, standing and stretching it is painful. She does do an exercise video at home with jumping but she has pain with this.  Walking faster  or turning makes it worse.  She is limited to standing for about an hour, pain goes up to 5/10.  Pain with crossing her leg over (at ankles)  She has tried injections and that helped some.  She would like to be able to reduce pain while walking.   PERTINENT HISTORY:  None   PAIN:  Are you having pain? Yes: NPRS scale: none current 0/10.  5/10 when walking  Pain location: Rt medial heel  Pain description: swollen, sharp Aggravating factors: Walking  Relieving factors: injection, meds don't help much  PRECAUTIONS: None  RED FLAGS: None   WEIGHT BEARING RESTRICTIONS: No  FALLS:  Has patient fallen in last 6 months? No  LIVING ENVIRONMENT: Lives with: lives with their family Lives in: House/apartment Stairs: No Has following equipment at home: None  OCCUPATION: not working   PLOF: Independent  PATIENT GOALS: pain relief   NEXT MD VISIT: as needed   OBJECTIVE:  Note: Objective measures were completed at Evaluation unless otherwise noted.  DIAGNOSTIC FINDINGS: Moderate tendinosis of the peroneus brevis with a partial-thickness tear just distal to the lateral malleolus.  PATIENT SURVEYS:  LEFS 44/80  COGNITION: Overall cognitive status: Within functional limits for tasks assessed  SENSATION: WFL  EDEMA:  Figure 8: 18. 5 inches  Puffiness in Rt heel noted  MUSCLE LENGTH: Hamstrings:tight  Thomas test: NT   POSTURE:  knees hyperextend, lacks arch support, pes planus   PALPATION: TTP pain on heel toward medial aspect , pocket of swelling   LOWER EXTREMITY ROM:  Active ROM Right eval Left eval  Hip flexion    Hip extension    Hip abduction    Hip adduction    Hip internal rotation    Hip external rotation    Knee flexion    Knee extension    Ankle dorsiflexion 10   Ankle plantarflexion 45   Ankle inversion 45   Ankle eversion 11    (Blank rows = not tested)  LOWER EXTREMITY MMT:  MMT Right eval Left eval  Hip flexion    Hip extension     Hip abduction    Hip adduction    Hip internal rotation    Hip external rotation    Knee flexion    Knee extension    Ankle dorsiflexion 5   Ankle plantarflexion 5   Ankle inversion 5   Ankle eversion 5    (Blank rows = not tested)  LOWER EXTREMITY SPECIAL TESTS:    FUNCTIONAL TESTS:  5 times sit to stand: NT  30 seconds chair stand test 2 minute walk test: 495 feet  SLS more difficult on Rt LE   GAIT: Distance walked: 100  Assistive device utilized: None Level of assistance: Complete Independence Comments: did not assess due to time                                                                                                                     TREATMENT DATE:   [facility] Adult PT Treatment:                                                DATE: 08/18/23 Therapeutic Activity: Standing calf off step 1 min  Calf raises off step x 12  2 sets in eversion and inversion x 10 , 1 UE support  Single leg balance on Airex multiple trials with slight knee flexion up to 20-25 sec (Lt is better than Rt ) Semi tandem on Airex 30 sec , then eyes closed 10 sec  Seated ankle DF blue band x 15 Blue band eversion/inversion looped  Seated calf stretch with towel x 3 each side  Bilateral towel scrunches  Modalities: na Self Care: Patient was given plantar fasciitis resource via epic patient instructions given in Spanish Anti-inflammatory medication elevation and exercises to improve swelling in the right ankle. Further discussion about stretching after walking in the morning and plantar fasciitis. Use a tennis ball to relieve the heel and arch pain  [facility] Adult PT Treatment:  DATE: 08/11/23 Therapeutic Activity: Towel scrunches 2x15  Standing calf stretch (gastroc/soleus) 30 each c rolled towel Standing heel raises on rolled towel 3 x 15  Hamstring calf with towel 2 x 30 sec Single leg balance on Airex pad with 1 UE support several trials to  maintain balance to 30 sec  Manual Therapy: STM to the plantar fascia Self Care: Pt Ed for STM to the plantar fascia daily; frozen water bottle massage with flare ups; avoid walking barefoot and use best supportive shoes as much as possible; and to complete stretches daily, and strengthening exs every other day   [facility] Adult PT Treatment:                                                DATE: 08/04/23 Therapeutic Activity: 2 min walk test  495 feet, pain increased to 8/10 Standing calf stretch (gastroc/soleus)  Standing heel raises 3 x 15 in varied positions  Hamstring calf with towel x 30 sec bilateral x 2  Eversion and inversion 2 ways GTB added to HEP  Single leg balance on Airex pad with 1 UE support several trials to maintain balance to 30 sec  Self Care:  Discussed insoles and foot support in her new shoes.     [facility] Adult PT Treatment:                                                DATE: 07/28/23 Therapeutic Activity: Hamstring/ITB Wall stretch for calf Verbal review of SLS and HEP  Self Care: See below pt. Ed.      PATIENT EDUCATION:  Education details: PT/POC, dry needling, HEP, ice, orthotics, foot position  Person educated: Patient Education method: Explanation, Demonstration, and Handouts Education comprehension: verbalized understanding, returned demonstration, and needs further education  HOME EXERCISE PROGRAM: Access Code: [email or web address] URL: [email or web address] Date: 08/11/2023 Prepared by: Liborio Reeds  Exercises - Single Leg Stance with Support  - 1 x daily - 7 x weekly - 1 sets - 5 reps - 30 hold - Gastroc Stretch on Wall  - 1 x daily - 7 x weekly - 1 sets - 3 reps - 30 hold - Soleus Stretch on Wall  - 1 x daily - 7 x weekly - 1 sets - 3 reps - 30 hold - Supine Hamstring Stretch with Strap  - 1 x daily - 7 x weekly - 1 sets - 3 reps - 30 hold - Standing Heel Raise with Support  - 1 x daily - 7 x weekly - 3 sets - 10 reps - 2 hold - Seated Ankle Eversion  with Resistance  - 1 x daily - 7 x weekly - 2 sets - 10 reps - 5 hold - Seated Figure 4 Ankle Inversion with Resistance  - 1 x daily - 7 x weekly - 2 sets - 10 reps - 5 hold - Seated Toe Towel Scrunches  - 1 x daily - 7 x weekly - 2 sets - 15 reps - 3 hold    CLINICAL IMPRESSION: Patient able to tolerate both sitting anf standing exercises for balance, intrinsics and ankle strength with minimal pain in the Rt medial aspect of her foot, ankle.  She was advised to add  stretching after walking as well as tennis ball for rolling her arches.  Written info on plantar fasciitis given to patient in spanish for further reinforcement. Pt will continue to benefit from skilled PT to address impairments for improved R foot function with minimized pain.   Patient is a 48 y.o. female who was seen today for physical therapy evaluation and treatment for Rt heel pain.  Her pain is all in her heel, but does recall a time last year when she had to walk a great deal and did have some swelling in her Rt ankle.  MRI confirmed a partial tear in peroneus brevis but no longer has lateral foot/ankle pain.    OBJECTIVE IMPAIRMENTS: decreased balance, decreased mobility, difficulty walking, increased fascial restrictions, increased muscle spasms, and pain.   ACTIVITY LIMITATIONS: standing and locomotion level  PARTICIPATION LIMITATIONS: community activity  PERSONAL FACTORS: Time since onset of injury/illness/exacerbation are also affecting patient's functional outcome.   REHAB POTENTIAL: Excellent  CLINICAL DECISION MAKING: Evolving/moderate complexity  EVALUATION COMPLEXITY: Moderate   GOALS: Goals reviewed with patient? Yes  SHORT TERM GOALS: Target date: 08/11/2023   Patient will be independent with home exercise program for ankle and foot Baseline: HEP given  Goal status: ongoing   2.  Patient will obtain custom orthotics or over-the-counter orthotics in order to improve foot position Baseline: will bring  in  Goal status:ongoing   3.  Patient will be independent with concept of anti-inflammation/self-care tips Baseline: needs cues  Goal status: INITIAL   LONG TERM GOALS: Target date: 09/08/2023   Patient will be able to have independence with Home exercise program Baseline: Unknown given on evaluation Goal status: INITIAL  2.  Patient will be able to report pain in the right foot is no more than min with walking fast. Baseline: mod to severe  Goal status: INITIAL  3.  Patient will be able to ambulate in the community as needed with pain no more than 3/10 in Rt heel  Baseline: pain moderate 5/10-6/10 Goal status: INITIAL  4.  Patient will demonstrate single-leg balance on Rt LE  for min dynamic activities  Baseline: static, 15 sec  Goal status: INITIAL  5.  Patient will improve LEFS score to 67/80 Baseline: 55/80 Goal status: INITIAL    PLAN:  PT FREQUENCY: 1-2x/week  PT DURATION: 6- 8 weeks  PLANNED INTERVENTIONS: 97164- PT Re-evaluation, 97110-Therapeutic exercises, 97530- Therapeutic activity, 97112- Neuromuscular re-education, 97535- Self Care, 16109- Manual therapy, Patient/Family education, Balance training, Taping, Dry Needling, Joint mobilization, Cryotherapy, and Moist heat  PLAN FOR NEXT SESSION: check HEP, manual/DN, standing as tolerated for ankle proprioception and foot intrinsics   Essie Gehret, PT 08/18/2023, 11:49 AM   Marci Setter, PT 08/18/23 11:52 AM Phone: [phone] Fax: [phone]

## 2023-08-18 NOTE — Patient Instructions (Signed)
 Plantar fasciitis -en espanol

## 2023-08-25 ENCOUNTER — Encounter: Payer: Self-pay | Admitting: Physical Therapy

## 2023-08-25 ENCOUNTER — Ambulatory Visit: Payer: Self-pay | Admitting: Physical Therapy

## 2023-08-25 DIAGNOSIS — R262 Difficulty in walking, not elsewhere classified: Secondary | ICD-10-CM

## 2023-08-25 DIAGNOSIS — M79671 Pain in right foot: Secondary | ICD-10-CM

## 2023-08-25 NOTE — Therapy (Addendum)
 OUTPATIENT PHYSICAL THERAPY LOWER EXTREMITY NOTE  DISCHARGE   Patient Name: Stacie Pratt MRN: [id number] DOB:1976-01-31, 48 y.o., female Today's Date: 08/25/2023    PHYSICAL THERAPY DISCHARGE SUMMARY  Visits from Start of Care: 5  Current functional level related to goals / functional outcomes: See below    Remaining deficits: Unknown    Education / Equipment: HEP, activity, inserts/orthotics   Patient agrees to discharge. Patient goals were partially met. Patient is being discharged due to not returning since the last visit.     END OF SESSION:  PT End of Session - 08/25/23 1020     Visit Number 5    Number of Visits 8    Date for PT Re-Evaluation 09/22/23    Authorization Type [facility] FA    PT Start Time 1018    PT Stop Time 1104    PT Time Calculation (min) 46 min    Activity Tolerance Patient tolerated treatment well    Behavior During Therapy WFL for tasks assessed/performed                 Past Medical History:  Diagnosis Date   Anxiety    Hyperlipidemia    Thyroid  disease    Past Surgical History:  Procedure Laterality Date   CESAREAN SECTION     3x   TUBAL LIGATION     Patient Active Problem List   Diagnosis Date Noted   Herpes zoster without complication 02/02/2020    PCP: Winda Hastings, NP  REFERRING PROVIDER: Clemetine Cypher, DPM  REFERRING DIAG:  M72.2 (ICD-10-CM) - Plantar fasciitis of right foot    THERAPY DIAG:  Pain of right heel  Difficulty in walking, not elsewhere classified  Rationale for Evaluation and Treatment: Rehabilitation  ONSET DATE: 10 + mos   SUBJECTIVE:   SUBJECTIVE STATEMENT: Pt has a little pain this AM.  She got a smaller looped band which she uses at home and it is better.  She [facility] an orthotic at [facility] whic has helped too.  She did not walk this AM.  Her FA is running out "should I make more appts?"  Patient presents with chronic plantar fasciitis, referred from Dr. Jinnie Mountain with issues  going on for over 10 months.  She had an MRI which showed no tear. She was told about a yr ago they saw a tear but the most recent MD did not find it.  She has difficulty with walking, standing and stretching it is painful. She does do an exercise video at home with jumping but she has pain with this.  Walking faster or turning makes it worse.  She is limited to standing for about an hour, pain goes up to 5/10.  Pain with crossing her leg over (at ankles)  She has tried injections and that helped some.  She would like to be able to reduce pain while walking.   PERTINENT HISTORY:  None   PAIN:  Are you having pain? Yes: NPRS scale: min/10.  Less pain when walking  Pain location: Rt medial heel  Pain description: swollen, sharp Aggravating factors: Walking  Relieving factors: injection, meds don't help much  PRECAUTIONS: None  RED FLAGS: None   WEIGHT BEARING RESTRICTIONS: No  FALLS:  Has patient fallen in last 6 months? No  LIVING ENVIRONMENT: Lives with: lives with their family Lives in: House/apartment Stairs: No Has following equipment at home: None  OCCUPATION: not working   PLOF: Independent  PATIENT GOALS: pain relief  NEXT MD VISIT: as needed   OBJECTIVE:  Note: Objective measures were completed at Evaluation unless otherwise noted.  DIAGNOSTIC FINDINGS: Moderate tendinosis of the peroneus brevis with a partial-thickness tear just distal to the lateral malleolus.  PATIENT SURVEYS:  LEFS 44/80  COGNITION: Overall cognitive status: Within functional limits for tasks assessed     SENSATION: WFL  EDEMA:  Figure 8: 18. 5 inches  Puffiness in Rt heel noted  MUSCLE LENGTH: Hamstrings:tight  Thomas test: NT   POSTURE: knees hyperextend, lacks arch support, pes planus   PALPATION: TTP pain on heel toward medial aspect , pocket of swelling   LOWER EXTREMITY ROM:  Active ROM Right eval Left eval  Hip flexion    Hip extension    Hip abduction     Hip adduction    Hip internal rotation    Hip external rotation    Knee flexion    Knee extension    Ankle dorsiflexion 10   Ankle plantarflexion 45   Ankle inversion 45   Ankle eversion 11    (Blank rows = not tested)  LOWER EXTREMITY MMT:  MMT Right eval Left eval  Hip flexion    Hip extension    Hip abduction    Hip adduction    Hip internal rotation    Hip external rotation    Knee flexion    Knee extension    Ankle dorsiflexion 5   Ankle plantarflexion 5   Ankle inversion 5   Ankle eversion 5    (Blank rows = not tested)  LOWER EXTREMITY SPECIAL TESTS:  NT  FUNCTIONAL TESTS:  5 times sit to stand: NT  30 seconds chair stand test 2 minute walk test: 495 feet  SLS more difficult on Rt LE   GAIT: Distance walked: 100  Assistive device utilized: None Level of assistance: Complete Independence Comments: did not assess due to time                                                                                                                     TREATMENT DATE:    [facility] Adult PT Treatment:                                                DATE: 08/25/23 Therapeutic Exercise: Standing slant board stretch 1 min  Standing heel raises x 20 off step  Blue band eversion/inversion looped  Seated calf stretch with towel x 3 each side  Therapeutic Activity: Rockerboard M/L and A/P balance exercises, EO and EC  Standing on Airex: hip abduction, hinge x 15 each LE  Bilateral towel scrunches  Towel inversion and eversion  Self Care: Activity mods, walking, inflammation  Plan of care   [facility] Adult PT Treatment:  DATE: 08/18/23 Therapeutic Activity: Standing calf off step 1 min  Calf raises off step x 12  2 sets in eversion and inversion x 10 , 1 UE support  Single leg balance on Airex multiple trials with slight knee flexion up to 20-25 sec (Lt is better than Rt ) Semi tandem on Airex 30 sec , then eyes closed 10 sec   Seated ankle DF blue band x 15 Blue band eversion/inversion looped  Seated calf stretch with towel x 3 each side  Bilateral towel scrunches  Modalities: na Self Care: Patient was given plantar fasciitis resource via epic patient instructions given in Spanish Anti-inflammatory medication elevation and exercises to improve swelling in the right ankle. Further discussion about stretching after walking in the morning and plantar fasciitis. Use a tennis ball to relieve the heel and arch pain  [facility] Adult PT Treatment:                                                DATE: 08/11/23 Therapeutic Activity: Towel scrunches 2x15  Standing calf stretch (gastroc/soleus) 30 each c rolled towel Standing heel raises on rolled towel 3 x 15  Hamstring calf with towel 2 x 30 sec Single leg balance on Airex pad with 1 UE support several trials to maintain balance to 30 sec  Manual Therapy: STM to the plantar fascia Self Care: Pt Ed for STM to the plantar fascia daily; frozen water bottle massage with flare ups; avoid walking barefoot and use best supportive shoes as much as possible; and to complete stretches daily, and strengthening exs every other day   [facility] Adult PT Treatment:                                                DATE: 08/04/23 Therapeutic Activity: 2 min walk test  495 feet, pain increased to 8/10 Standing calf stretch (gastroc/soleus)  Standing heel raises 3 x 15 in varied positions  Hamstring calf with towel x 30 sec bilateral x 2  Eversion and inversion 2 ways GTB added to HEP  Single leg balance on Airex pad with 1 UE support several trials to maintain balance to 30 sec  Self Care:  Discussed insoles and foot support in her new shoes.     [facility] Adult PT Treatment:                                                DATE: 07/28/23 Therapeutic Activity: Hamstring/ITB Wall stretch for calf Verbal review of SLS and HEP  Self Care: See below pt. Ed.      PATIENT EDUCATION:  Education  details: PT/POC, dry needling, HEP, ice, orthotics, foot position  Person educated: Patient Education method: Explanation, Demonstration, and Handouts Education comprehension: verbalized understanding, returned demonstration, and needs further education  HOME EXERCISE PROGRAM: Access Code: [email or web address] URL: [email or web address] Date: 08/11/2023 Prepared by: Liborio Reeds  Exercises - Single Leg Stance with Support  - 1 x daily - 7 x weekly - 1 sets - 5 reps - 30 hold - Gastroc Stretch on Wall  - 1 x daily -  7 x weekly - 1 sets - 3 reps - 30 hold - Soleus Stretch on Wall  - 1 x daily - 7 x weekly - 1 sets - 3 reps - 30 hold - Supine Hamstring Stretch with Strap  - 1 x daily - 7 x weekly - 1 sets - 3 reps - 30 hold - Standing Heel Raise with Support  - 1 x daily - 7 x weekly - 3 sets - 10 reps - 2 hold - Seated Ankle Eversion with Resistance  - 1 x daily - 7 x weekly - 2 sets - 10 reps - 5 hold - Seated Figure 4 Ankle Inversion with Resistance  - 1 x daily - 7 x weekly - 2 sets - 10 reps - 5 hold - Seated Toe Towel Scrunches  - 1 x daily - 7 x weekly - 2 sets - 15 reps - 3 hold    CLINICAL IMPRESSION: Patient was able to walk for fitness the other day without increased pain.  The only thing that is not improved is the "inflammation" in her heel.  She was educated on measures she can take to reduce systemic inflammation as well as local modalities.  She will be on hold at this point until she gets a renewal with her financial assist.  Pt will continue to benefit from skilled PT to address impairments for improved R foot function with minimized pain.    Patient is a 48 y.o. female who was seen today for physical therapy evaluation and treatment for Rt heel pain.  Her pain is all in her heel, but does recall a time last year when she had to walk a great deal and did have some swelling in her Rt ankle.  MRI confirmed a partial tear in peroneus brevis but no longer has lateral foot/ankle  pain.    OBJECTIVE IMPAIRMENTS: decreased balance, decreased mobility, difficulty walking, increased fascial restrictions, increased muscle spasms, and pain.   ACTIVITY LIMITATIONS: standing and locomotion level  PARTICIPATION LIMITATIONS: community activity  PERSONAL FACTORS: Time since onset of injury/illness/exacerbation are also affecting patient's functional outcome.   REHAB POTENTIAL: Excellent  CLINICAL DECISION MAKING: Evolving/moderate complexity  EVALUATION COMPLEXITY: Moderate   GOALS: Goals reviewed with patient? Yes  SHORT TERM GOALS: Target date: 08/11/2023   Patient will be independent with home exercise program for ankle and foot Baseline: HEP given  Goal status: MET   2.  Patient will obtain custom orthotics or over-the-counter orthotics in order to improve foot position Baseline: will bring in  Goal status:met (OTC)  3.  Patient will be independent with concept of anti-inflammation/self-care tips Baseline: needs cues  Goal status: met    LONG TERM GOALS: Target date: 09/08/2023   Patient will be able to have independence with Home exercise program Baseline: Unknown given on evaluation Goal status: ongoing   2.  Patient will be able to report pain in the right foot is no more than min with walking fast. Baseline: mod to severe 08/25/23: no pain yesterday when she did this  Goal status: ongoing   3.  Patient will be able to ambulate in the community as needed with pain no more than 3/10 in Rt heel  Baseline: pain moderate 5/10-6/10 Goal status: ongoing   4.  Patient will demonstrate single-leg balance on Rt LE  for min dynamic activities  Baseline: static, 15 sec  Goal status: MET   5.  Patient will improve LEFS score to 67/80 Baseline: 55/80 Goal status:ongoing  PLAN:  PT FREQUENCY: 1-2x/week  PT DURATION: 6- 8 weeks  PLANNED INTERVENTIONS: 97164- PT Re-evaluation, 97110-Therapeutic exercises, 97530- Therapeutic activity, 97112-  Neuromuscular re-education, 97535- Self Care, 16109- Manual therapy, Patient/Family education, Balance training, Taping, Dry Needling, Joint mobilization, Cryotherapy, and Moist heat  PLAN FOR NEXT SESSION: hold until FA is active.  May only need a couple more.    Susi Goslin, PT 08/25/2023, 12:13 PM   Marci Setter, PT 08/25/23 12:13 PM Phone: [phone] Fax: [phone]

## 2023-09-13 ENCOUNTER — Other Ambulatory Visit: Payer: Self-pay

## 2023-09-21 ENCOUNTER — Other Ambulatory Visit (HOSPITAL_COMMUNITY)
Admission: RE | Admit: 2023-09-21 | Discharge: 2023-09-21 | Disposition: A | Discharge status: Home / Self Care | Payer: Self-pay | Source: Ambulatory Visit | Attending: Nurse Practitioner | Admitting: Nurse Practitioner

## 2023-09-21 ENCOUNTER — Encounter: Payer: Self-pay | Admitting: Nurse Practitioner

## 2023-09-21 ENCOUNTER — Ambulatory Visit: Payer: Self-pay | Attending: Nurse Practitioner | Admitting: Nurse Practitioner

## 2023-09-21 ENCOUNTER — Other Ambulatory Visit: Payer: Self-pay

## 2023-09-21 VITALS — BP 107/70 | HR 62 | Resp 19 | Ht 59.5 in | Wt 139.6 lb

## 2023-09-21 DIAGNOSIS — F419 Anxiety disorder, unspecified: Secondary | ICD-10-CM

## 2023-09-21 DIAGNOSIS — F418 Other specified anxiety disorders: Secondary | ICD-10-CM

## 2023-09-21 DIAGNOSIS — M79601 Pain in right arm: Secondary | ICD-10-CM

## 2023-09-21 DIAGNOSIS — R7303 Prediabetes: Secondary | ICD-10-CM

## 2023-09-21 DIAGNOSIS — N760 Acute vaginitis: Secondary | ICD-10-CM | POA: Insufficient documentation

## 2023-09-21 DIAGNOSIS — E039 Hypothyroidism, unspecified: Secondary | ICD-10-CM

## 2023-09-21 MED ORDER — HYDROXYZINE HCL 10 MG PO TABS
10.0000 mg | ORAL_TABLET | Freq: Three times a day (TID) | ORAL | 1 refills | Status: AC | PRN
  Filled 2023-09-21: qty 60, 20d supply, fill #0

## 2023-09-21 MED ORDER — ESCITALOPRAM OXALATE 10 MG PO TABS
10.0000 mg | ORAL_TABLET | Freq: Every day | ORAL | 2 refills | Status: AC
  Filled 2023-09-21: qty 30, 30d supply, fill #0

## 2023-09-21 MED ORDER — DICLOFENAC SODIUM 1 % EX GEL
4.0000 g | Freq: Four times a day (QID) | CUTANEOUS | 1 refills | Status: AC
  Filled 2023-09-21: qty 200, 25d supply, fill #0

## 2023-09-21 NOTE — Progress Notes (Signed)
 Swelling of face and arms. Vaginal burning when wiping.

## 2023-09-21 NOTE — Progress Notes (Signed)
 Assessment & Plan:  [name] was seen today for hypothyroidism and vaginal itching.  Diagnoses and all orders for this visit:  Anxiety and depression Does not endorse any thoughts of self-harm today. -     escitalopram (LEXAPRO) 10 MG tablet; Take 1 tablet (10 mg total) by mouth daily. Para la ansiedad y la depresin. Tomar todos los das. -     hydrOXYzine  (ATARAX ) 10 MG tablet; Take 1 tablet (10 mg total) by mouth 3 (three) times daily as needed. Tomar segn sea necesario para la ansiedad  Acute vaginitis -     Cervicovaginal ancillary only  Right arm pain -     diclofenac  Sodium (VOLTAREN ) 1 % GEL; Apply 4 g topically 4 (four) times daily. To right arm Also recommended a copper sleeve to be worn daily for the next 4 to 6 weeks  Prediabetes -     CMP14+EGFR -     Hemoglobin A1c Continue blood sugar control as discussed in office today, low carbohydrate diet, and regular physical exercise as tolerated, 150 minutes per week (30 min each day, 5 days per week, or 50 min 3 days per week). Keep blood sugar logs with fasting goal of 90-130 mg/dl, post prandial (after you eat) less than 180.  For Hypoglycemia: BS <60 and Hyperglycemia BS >400; contact the clinic ASAP. Annual eye exams and foot exams are recommended.   Acquired hypothyroidism -     Thyroid  Panel With TSH    Patient has been counseled on age-appropriate routine health concerns for screening and prevention. These are reviewed and up-to-date. Referrals have been placed accordingly. Immunizations are up-to-date or declined.    Subjective:   Chief Complaint  Patient presents with   Hypothyroidism   Vaginal Itching    Stacie Pratt 48 y.o. female presents to office today for follow up to hypothyroidism  VRI was used to communicate directly with patient for the entire encounter including providing detailed patient instructions.    She endorses symptoms of vaginal irritation and itching.   Experiencing  intermittent swelling in the right upper arm.  There is mild palpable pain over the exam today however no signs of swelling or edema.   Feels more anxious and depressed. She has been tried on buspar  and hydroxyzine  in the past. Stopped taking and she is unsure if they were effective.      09/21/2023   10:08 AM 03/23/2023   11:24 AM 11/23/2022   11:19 AM  Depression screen PHQ 2/9  Decreased Interest 3 3 1   Down, Depressed, Hopeless 3 3 2   PHQ - 2 Score 6 6 3   Altered sleeping 2 0 1  Tired, decreased energy 3 2 2   Change in appetite 1 1 1   Feeling bad or failure about yourself  2 1 3   Trouble concentrating 1 1 2   Moving slowly or fidgety/restless 2 1 1   Suicidal thoughts 1 1 1   PHQ-9 Score 18 13 14   Difficult doing work/chores Very difficult Very difficult        09/21/2023   10:08 AM 03/23/2023   11:25 AM 11/23/2022   11:19 AM 07/21/2022    4:04 PM  GAD 7 : Generalized Anxiety Score  Nervous, Anxious, on Edge 3 3 2 2   Control/stop worrying 2 3 2 1   Worry too much - different things 2 3 2 2   Trouble relaxing 3 1 2  0  Restless 0 0 0 0  Easily annoyed or irritable 3 3 3 3   Afraid -  awful might happen 1 2 2 1   Total GAD 7 Score 14 15 13 9       Hypothyroidism Thyroid  level is low normal and she is taking levothyroxine  as prescribed. Lab Results  Component Value Date   TSH 0.947 02/11/2023    Review of Systems  Constitutional:  Negative for fever, malaise/fatigue and weight loss.  HENT: Negative.  Negative for nosebleeds.   Eyes: Negative.  Negative for blurred vision, double vision and photophobia.  Respiratory: Negative.  Negative for cough and shortness of breath.   Cardiovascular: Negative.  Negative for chest pain, palpitations and leg swelling.  Gastrointestinal: Negative.  Negative for heartburn, nausea and vomiting.  Genitourinary:        See HPI  Musculoskeletal:  Positive for myalgias.  Neurological: Negative.  Negative for dizziness, focal weakness, seizures  and headaches.  Psychiatric/Behavioral:  Positive for depression. Negative for suicidal ideas. The patient is nervous/anxious.     Past Medical History:  Diagnosis Date   Anxiety    Hyperlipidemia    Thyroid  disease     Past Surgical History:  Procedure Laterality Date   CESAREAN SECTION     3x   TUBAL LIGATION      Family History  Problem Relation Age of Onset   Healthy Mother    Healthy Father    Diabetes Sister     Social History Reviewed with no changes to be made today.   Outpatient Medications Prior to Visit  Medication Sig Dispense Refill   levothyroxine  (SYNTHROID ) 88 MCG tablet Take 1 tablet (88 mcg total) by mouth daily. 90 tablet 1   meloxicam  (MOBIC ) 15 MG tablet Take 1 tablet (15 mg total) by mouth daily. 30 tablet 3   terbinafine  (LAMISIL ) 250 MG tablet Take 1 tablet (250 mg total) by mouth daily. 90 tablet 0   Docusate Sodium  (COLACE PO) Take 1 capsule by mouth daily as needed.     cetirizine (ZYRTEC) 10 MG chewable tablet Chew 10 mg by mouth daily as needed for allergies. (Patient not taking: Reported on 09/21/2023)     ibuprofen  (ADVIL ) 800 MG tablet Take 1 tablet (800 mg total) by mouth every 8 (eight) hours as needed (menstrual cramping). (Patient not taking: Reported on 09/21/2023) 60 tablet 1   meloxicam  (MOBIC ) 15 MG tablet Take 1 tablet (15 mg total) by mouth daily. (Patient not taking: Reported on 09/21/2023) 30 tablet 3   No facility-administered medications prior to visit.    No Known Allergies     Objective:    BP 107/70 (BP Location: Left Arm, Patient Position: Sitting, Cuff Size: Normal)   Pulse 62   Resp 19   Ht 4' 11.5" (1.511 m)   Wt 139 lb 9.6 oz (63.3 kg)   LMP 09/04/2023 (Approximate)   SpO2 98%   BMI 27.72 kg/m  Wt Readings from Last 3 Encounters:  09/21/23 139 lb 9.6 oz (63.3 kg)  03/23/23 135 lb 12.8 oz (61.6 kg)  01/14/23 136 lb (61.7 kg)    Physical Exam Vitals and nursing note reviewed.  Constitutional:       Appearance: She is well-developed.  HENT:     Head: Normocephalic and atraumatic.  Cardiovascular:     Rate and Rhythm: Normal rate and regular rhythm.     Heart sounds: Normal heart sounds. No murmur heard.    No friction rub. No gallop.  Pulmonary:     Effort: Pulmonary effort is normal. No tachypnea or respiratory distress.  Breath sounds: Normal breath sounds. No decreased breath sounds, wheezing, rhonchi or rales.  Chest:     Chest wall: No tenderness.  Abdominal:     General: Bowel sounds are normal.     Palpations: Abdomen is soft.  Musculoskeletal:        General: Normal range of motion.     Right upper arm: Tenderness present. No swelling, edema, deformity or lacerations.     Left upper arm: Normal.       Arms:     Cervical back: Normal range of motion.  Skin:    General: Skin is warm and dry.  Neurological:     Mental Status: She is alert and oriented to person, place, and time.     Coordination: Coordination normal.  Psychiatric:        Behavior: Behavior normal. Behavior is cooperative.        Thought Content: Thought content normal.        Judgment: Judgment normal.          Patient has been counseled extensively about nutrition and exercise as well as the importance of adherence with medications and regular follow-up. The patient was given clear instructions to go to ER or return to medical center if symptoms don't improve, worsen or new problems develop. The patient verbalized understanding.   Follow-up: Return in about 6 weeks (around 11/02/2023) for anxiety and depression .   Collins Dean, FNP-BC [facility] [place], [place] [phone]   09/21/2023, 11:59 AM

## 2023-09-22 ENCOUNTER — Other Ambulatory Visit: Payer: Self-pay

## 2023-09-22 ENCOUNTER — Ambulatory Visit: Payer: Self-pay | Admitting: Nurse Practitioner

## 2023-09-22 LAB — CMP14+EGFR
ALT: 26 IU/L (ref 0–32)
AST: 30 IU/L (ref 0–40)
Albumin: 4.6 g/dL (ref 3.9–4.9)
Alkaline Phosphatase: 49 IU/L (ref 44–121)
BUN/Creatinine Ratio: 16 (ref 9–23)
BUN: 12 mg/dL (ref 6–24)
Bilirubin Total: 0.3 mg/dL (ref 0.0–1.2)
CO2: 19 mmol/L — ABNORMAL LOW (ref 20–29)
Calcium: 9.6 mg/dL (ref 8.7–10.2)
Chloride: 100 mmol/L (ref 96–106)
Creatinine, Ser: 0.73 mg/dL (ref 0.57–1.00)
Globulin, Total: 2.8 g/dL (ref 1.5–4.5)
Glucose: 86 mg/dL (ref 70–99)
Potassium: 4.7 mmol/L (ref 3.5–5.2)
Sodium: 134 mmol/L (ref 134–144)
Total Protein: 7.4 g/dL (ref 6.0–8.5)
eGFR: 101 mL/min/{1.73_m2} (ref >59)

## 2023-09-22 LAB — CERVICOVAGINAL ANCILLARY ONLY
Bacterial Vaginitis (gardnerella): NEGATIVE
Candida Glabrata: NEGATIVE
Candida Vaginitis: NEGATIVE
Chlamydia: NEGATIVE
Comment: NEGATIVE
Comment: NEGATIVE
Comment: NEGATIVE
Comment: NEGATIVE
Comment: NEGATIVE
Comment: NORMAL
Neisseria Gonorrhea: NEGATIVE
Trichomonas: NEGATIVE

## 2023-09-22 LAB — THYROID PANEL WITH TSH
Free Thyroxine Index: 2.5 (ref 1.2–4.9)
T3 Uptake Ratio: 27 % (ref 24–39)
T4, Total: 9.2 ug/dL (ref 4.5–12.0)
TSH: 2.21 u[IU]/mL (ref 0.450–4.500)

## 2023-09-22 LAB — HEMOGLOBIN A1C
Est. average glucose Bld gHb Est-mCnc: 111 mg/dL
Hgb A1c MFr Bld: 5.5 % (ref 4.8–5.6)

## 2023-09-23 ENCOUNTER — Ambulatory Visit: Payer: No Typology Code available for payment source | Admitting: Podiatry

## 2023-10-27 ENCOUNTER — Other Ambulatory Visit: Payer: Self-pay

## 2023-11-02 ENCOUNTER — Ambulatory Visit: Payer: Self-pay | Admitting: Nurse Practitioner

## 2023-11-24 ENCOUNTER — Telehealth: Payer: Self-pay

## 2023-11-24 NOTE — Telephone Encounter (Signed)
 Copied from CRM [id number]. Topic: Clinical - Medical Advice >> Nov 24, 2023  9:30 AM Willma R wrote: Reason for CRM: Patient states her children were diagnosed with Hand, Foot, and Mouth and would like to know if it is okay for her to keep her appointment on 08/06 of should she reschedule.  Patient can be reached at [phone]

## 2023-11-25 NOTE — Telephone Encounter (Signed)
 Thank you :)

## 2023-11-29 ENCOUNTER — Other Ambulatory Visit: Payer: Self-pay

## 2023-11-30 ENCOUNTER — Telehealth: Payer: Self-pay | Admitting: Nurse Practitioner

## 2023-11-30 NOTE — Telephone Encounter (Signed)
 Pt confirmed appt 8/5 per daniela

## 2023-12-01 ENCOUNTER — Other Ambulatory Visit: Payer: Self-pay

## 2023-12-01 ENCOUNTER — Ambulatory Visit: Payer: Self-pay | Attending: Nurse Practitioner | Admitting: Nurse Practitioner

## 2023-12-01 ENCOUNTER — Encounter: Payer: Self-pay | Admitting: Nurse Practitioner

## 2023-12-01 VITALS — BP 97/65 | HR 59 | Resp 18 | Ht 59.5 in | Wt 136.6 lb

## 2023-12-01 DIAGNOSIS — Z1231 Encounter for screening mammogram for malignant neoplasm of breast: Secondary | ICD-10-CM

## 2023-12-01 DIAGNOSIS — F418 Other specified anxiety disorders: Secondary | ICD-10-CM

## 2023-12-01 DIAGNOSIS — E039 Hypothyroidism, unspecified: Secondary | ICD-10-CM

## 2023-12-01 DIAGNOSIS — F32A Depression, unspecified: Secondary | ICD-10-CM

## 2023-12-01 DIAGNOSIS — E038 Other specified hypothyroidism: Secondary | ICD-10-CM

## 2023-12-01 NOTE — Progress Notes (Unsigned)
 Assessment & Plan:  There are no diagnoses linked to this encounter.  Patient has been counseled on age-appropriate routine health concerns for screening and prevention. These are reviewed and up-to-date. Referrals have been placed accordingly. Immunizations are up-to-date or declined.    Subjective:   Chief Complaint  Patient presents with   Depression   Anxiety    VRI was used to communicate directly with patient for the entire encounter including providing detailed patient instructions.          12/01/2023    9:58 AM 09/21/2023   10:08 AM 03/23/2023   11:24 AM  Depression screen PHQ 2/9  Decreased Interest 3 3 3   Down, Depressed, Hopeless 2 3 3   PHQ - 2 Score 5 6 6   Altered sleeping 1 2 0  Tired, decreased energy 3 3 2   Change in appetite  1 1  Feeling bad or failure about yourself  3 2 1   Trouble concentrating 2 1 1   Moving slowly or fidgety/restless 2 2 1   Suicidal thoughts 1 1 1   PHQ-9 Score 17 18 13   Difficult doing work/chores Very difficult Very difficult Very difficult       12/01/2023    9:58 AM 09/21/2023   10:08 AM 03/23/2023   11:25 AM 11/23/2022   11:19 AM  GAD 7 : Generalized Anxiety Score  Nervous, Anxious, on Edge 3 3 3 2   Control/stop worrying 3 2 3 2   Worry too much - different things 3 2 3 2   Trouble relaxing 1 3 1 2   Restless 0 0 0 0  Easily annoyed or irritable 3 3 3 3   Afraid - awful might happen 0 1 2 2   Total GAD 7 Score 13 14 15 13   Anxiety Difficulty Very difficult         ROS  Past Medical History:  Diagnosis Date   Anxiety    Hyperlipidemia    Thyroid  disease     Past Surgical History:  Procedure Laterality Date   CESAREAN SECTION     3x   TUBAL LIGATION      Family History  Problem Relation Age of Onset   Healthy Mother    Healthy Father    Diabetes Sister     Social History Reviewed with no changes to be made today.   Outpatient Medications Prior to Visit  Medication Sig Dispense Refill   diclofenac  Sodium  (VOLTAREN ) 1 % GEL Apply 4 g topically 4 (four) times daily. To right arm 200 g 1   escitalopram  (LEXAPRO ) 10 MG tablet Take 1 tablet (10 mg total) by mouth daily. Para la ansiedad y la depresin. Tomar todos los das. 30 tablet 2   hydrOXYzine  (ATARAX ) 10 MG tablet Take 1 tablet (10 mg total) by mouth 3 (three) times daily as needed. Tomar segn sea necesario para la ansiedad 60 tablet 1   levothyroxine  (SYNTHROID ) 88 MCG tablet Take 1 tablet (88 mcg total) by mouth daily. 90 tablet 1   meloxicam  (MOBIC ) 15 MG tablet Take 1 tablet (15 mg total) by mouth daily. 30 tablet 3   terbinafine  (LAMISIL ) 250 MG tablet Take 1 tablet (250 mg total) by mouth daily. (Patient not taking: Reported on 12/01/2023) 90 tablet 0   No facility-administered medications prior to visit.    No Known Allergies     Objective:    BP 97/65 (BP Location: Left Arm, Patient Position: Sitting, Cuff Size: Normal)   Pulse (!) 59   Resp 18  Ht 4' 11.5 (1.511 m)   Wt 136 lb 9.6 oz (62 kg)   LMP 11/28/2023 (Exact Date)   SpO2 98%   BMI 27.13 kg/m  Wt Readings from Last 3 Encounters:  12/01/23 136 lb 9.6 oz (62 kg)  09/21/23 139 lb 9.6 oz (63.3 kg)  03/23/23 135 lb 12.8 oz (61.6 kg)    Physical Exam       Patient has been counseled extensively about nutrition and exercise as well as the importance of adherence with medications and regular follow-up. The patient was given clear instructions to go to ER or return to medical center if symptoms don't improve, worsen or new problems develop. The patient verbalized understanding.   Follow-up: No follow-ups on file.   Haze LELON Servant, FNP-BC [facility] and [facility] [place], [place] [phone]   12/01/2023, 10:04 AM

## 2023-12-02 ENCOUNTER — Encounter: Payer: Self-pay | Admitting: Nurse Practitioner

## 2024-02-03 ENCOUNTER — Ambulatory Visit
Admission: RE | Admit: 2024-02-03 | Discharge: 2024-02-03 | Disposition: A | Discharge status: Home / Self Care | Payer: Self-pay | Source: Ambulatory Visit | Attending: Nurse Practitioner | Admitting: Nurse Practitioner

## 2024-02-03 DIAGNOSIS — Z1231 Encounter for screening mammogram for malignant neoplasm of breast: Secondary | ICD-10-CM

## 2024-02-09 ENCOUNTER — Ambulatory Visit: Payer: Self-pay | Admitting: Nurse Practitioner

## 2024-02-22 ENCOUNTER — Other Ambulatory Visit: Payer: Self-pay

## 2024-02-22 ENCOUNTER — Other Ambulatory Visit: Payer: Self-pay | Admitting: Family Medicine

## 2024-02-22 ENCOUNTER — Telehealth: Payer: Self-pay | Admitting: Pharmacist

## 2024-02-22 DIAGNOSIS — E039 Hypothyroidism, unspecified: Secondary | ICD-10-CM

## 2024-02-22 MED ORDER — LEVOTHYROXINE SODIUM 88 MCG PO TABS
88.0000 ug | ORAL_TABLET | Freq: Every day | ORAL | 0 refills | Status: DC
  Filled 2024-02-22: qty 30, 30d supply, fill #0
  Filled 2024-03-27: qty 30, 30d supply, fill #1
  Filled 2024-04-25: qty 30, 30d supply, fill #2

## 2024-02-22 NOTE — Telephone Encounter (Signed)
 Hey friend,   Received notice from pharmacy that they are having to change manufacturer of levothyroxine . Due to this change, we may need to consider repeat thyroid  labs at upcoming appt next month.

## 2024-02-23 ENCOUNTER — Other Ambulatory Visit: Payer: Self-pay

## 2024-03-01 ENCOUNTER — Other Ambulatory Visit: Payer: Self-pay

## 2024-03-02 ENCOUNTER — Other Ambulatory Visit: Payer: Self-pay

## 2024-03-03 ENCOUNTER — Other Ambulatory Visit: Payer: Self-pay

## 2024-03-03 ENCOUNTER — Ambulatory Visit: Payer: Self-pay | Admitting: Nurse Practitioner

## 2024-03-14 ENCOUNTER — Ambulatory Visit: Payer: Self-pay | Admitting: Nurse Practitioner

## 2024-03-27 ENCOUNTER — Other Ambulatory Visit: Payer: Self-pay

## 2024-03-28 ENCOUNTER — Other Ambulatory Visit: Payer: Self-pay

## 2024-04-11 ENCOUNTER — Other Ambulatory Visit: Payer: Self-pay

## 2024-04-11 ENCOUNTER — Encounter: Payer: Self-pay | Admitting: Nurse Practitioner

## 2024-04-11 ENCOUNTER — Ambulatory Visit: Payer: Self-pay | Attending: Nurse Practitioner | Admitting: Nurse Practitioner

## 2024-04-11 VITALS — BP 99/66 | HR 60 | Ht 59.5 in | Wt 133.6 lb

## 2024-04-11 DIAGNOSIS — Z23 Encounter for immunization: Secondary | ICD-10-CM

## 2024-04-11 DIAGNOSIS — E785 Hyperlipidemia, unspecified: Secondary | ICD-10-CM

## 2024-04-11 DIAGNOSIS — R7303 Prediabetes: Secondary | ICD-10-CM

## 2024-04-11 DIAGNOSIS — Z862 Personal history of diseases of the blood and blood-forming organs and certain disorders involving the immune mechanism: Secondary | ICD-10-CM

## 2024-04-11 DIAGNOSIS — L853 Xerosis cutis: Secondary | ICD-10-CM

## 2024-04-11 DIAGNOSIS — E039 Hypothyroidism, unspecified: Secondary | ICD-10-CM

## 2024-04-11 DIAGNOSIS — J302 Other seasonal allergic rhinitis: Secondary | ICD-10-CM

## 2024-04-11 MED ORDER — CETIRIZINE HCL 10 MG PO TABS
10.0000 mg | ORAL_TABLET | Freq: Every day | ORAL | 11 refills | Status: AC
  Filled 2024-04-11: qty 30, 30d supply, fill #0
  Filled 2024-05-11 (×2): qty 30, 30d supply, fill #1
  Filled 2024-05-19: qty 90, 90d supply, fill #1

## 2024-04-11 MED ORDER — IPRATROPIUM BROMIDE 0.03 % NA SOLN
2.0000 | Freq: Two times a day (BID) | NASAL | 12 refills | Status: AC
  Filled 2024-04-11: qty 30, 43d supply, fill #0
  Filled 2024-04-19: qty 30, 75d supply, fill #0

## 2024-04-11 MED ORDER — TRIAMCINOLONE ACETONIDE 0.5 % EX OINT
1.0000 | TOPICAL_OINTMENT | Freq: Two times a day (BID) | CUTANEOUS | 1 refills | Status: DC
  Filled 2024-04-11: qty 15, 30d supply, fill #0
  Filled 2024-05-04: qty 15, 30d supply, fill #1

## 2024-04-11 NOTE — Patient Instructions (Signed)
 If your concerns remain unresolved, you may contact the Grievance and Patient Relations Department at [phone].

## 2024-04-11 NOTE — Progress Notes (Signed)
 Assessment & Plan:  Ginnifer was seen today for depression and anxiety.  Diagnoses and all orders for this visit:  Acquired hypothyroidism -     Thyroid  Panel With TSH Awaiting thyroid  level results before medication refill. - Checked thyroid  levels before refilling medication.  Prediabetes -     CMP14+EGFR -     Hemoglobin A1c  Dry skin There is a small area of hypopigmented macular rash -     triamcinolone  ointment (KENALOG ) 0.5 %; Apply 1 Application topically 2 (two) times daily. Apply to back twice per day  Need for influenza vaccination -     Flu vaccine trivalent PF, 6mos and older(Flulaval,Afluria,Fluarix,Fluzone)  Dyslipidemia, goal LDL below 100 -     Lipid panel  History of anemia -     CBC with Differential  Seasonal allergies -     cetirizine  (ZYRTEC ) 10 MG tablet; Take 1 tablet (10 mg total) by mouth daily. -     ipratropium (ATROVENT ) 0.03 % nasal spray; Place 2 sprays into both nostrils every 12 (twelve) hours. Pharyngeal irritation with phlegm, mild redness, likely allergic rhinitis. - Recommended warm tea with honey or warm salt water gargles. - Continue current allergy medication   Patient has been counseled on age-appropriate routine health concerns for screening and prevention. These are reviewed and up-to-date. Referrals have been placed accordingly. Immunizations are up-to-date or declined.    Subjective:   Chief Complaint  Patient presents with   Depression   Anxiety    Stacie Pratt 48 y.o. female presents to office today for thyroid  follow up  VRI was used to communicate directly with patient for the entire encounter including providing detailed patient instructions.     She was prescribed lexapro  and hydroxyzine  however she states she never started taking it as she was concerned about possible side effects or reactions with taking her other medications.     She experiences phlegm in her throat. She is unsure if this is related to  allergies and mentions taking some form of allergy medication but not daily, though she does not specify which one.  She has a patchy dry area of skin. She has previously used Aquaphor and a low dose steroid cream prescribed for her back, but the dryness and itchiness persist. She applied the cream every night but does not recall the duration of use. During the time she used the cream, she noted her skin felt 'very sensitive'.   She discusses a previous mammogram experience where she felt excessive pressure was applied and seeks information on how to file a complaint.  Hypothyroidism She is taking levothyroxine  88 mg daily as prescribed. Experiencing symptoms of dry skin.  Lab Results  Component Value Date   TSH 2.210 09/21/2023    Review of Systems  Constitutional:  Negative for fever, malaise/fatigue and weight loss.  HENT: Negative.  Negative for nosebleeds.   Eyes: Negative.  Negative for blurred vision, double vision and photophobia.  Respiratory: Negative.  Negative for cough and shortness of breath.   Cardiovascular: Negative.  Negative for chest pain, palpitations and leg swelling.  Gastrointestinal: Negative.  Negative for heartburn, nausea and vomiting.  Musculoskeletal: Negative.  Negative for myalgias.  Skin:  Positive for itching and rash.  Neurological: Negative.  Negative for dizziness, focal weakness, seizures and headaches.  Endo/Heme/Allergies:  Positive for environmental allergies.  Psychiatric/Behavioral:  Positive for depression. Negative for suicidal ideas. The patient is nervous/anxious.     Past Medical History:  Diagnosis Date  Anxiety    Hyperlipidemia    Thyroid  disease     Past Surgical History:  Procedure Laterality Date   CESAREAN SECTION     3x   TUBAL LIGATION      Family History  Problem Relation Age of Onset   Healthy Mother    Healthy Father    Diabetes Sister     Social History Reviewed with no changes to be made today.    Outpatient Medications Prior to Visit  Medication Sig Dispense Refill   levothyroxine  (SYNTHROID ) 88 MCG tablet Take 1 tablet (88 mcg total) by mouth daily. 90 tablet 0   diclofenac  Sodium (VOLTAREN ) 1 % GEL Apply 4 g topically 4 (four) times daily. To right arm (Patient not taking: Reported on 04/11/2024) 200 g 1   escitalopram  (LEXAPRO ) 10 MG tablet Take 1 tablet (10 mg total) by mouth daily. Para la ansiedad y la depresin. Tomar todos los das. (Patient not taking: Reported on 04/11/2024) 30 tablet 2   hydrOXYzine  (ATARAX ) 10 MG tablet Take 1 tablet (10 mg total) by mouth 3 (three) times daily as needed. Tomar segn sea necesario para la ansiedad (Patient not taking: Reported on 04/11/2024) 60 tablet 1   meloxicam  (MOBIC ) 15 MG tablet Take 1 tablet (15 mg total) by mouth daily. (Patient not taking: Reported on 04/11/2024) 30 tablet 3   terbinafine  (LAMISIL ) 250 MG tablet Take 1 tablet (250 mg total) by mouth daily. (Patient not taking: Reported on 04/11/2024) 90 tablet 0   No facility-administered medications prior to visit.    Allergies[1]     Objective:    BP 99/66 (BP Location: Left Arm, Patient Position: Sitting, Cuff Size: Normal)   Pulse 60   Ht 4' 11.5 (1.511 m)   Wt 133 lb 9.6 oz (60.6 kg)   LMP 03/18/2024 (Approximate)   SpO2 98%   BMI 26.53 kg/m  Wt Readings from Last 3 Encounters:  04/11/24 133 lb 9.6 oz (60.6 kg)  12/01/23 136 lb 9.6 oz (62 kg)  09/21/23 139 lb 9.6 oz (63.3 kg)    Physical Exam Vitals and nursing note reviewed.  Constitutional:      Appearance: She is well-developed.  HENT:     Head: Normocephalic and atraumatic.     Mouth/Throat:     Mouth: Mucous membranes are moist. No oral lesions.     Pharynx: Postnasal drip present. No pharyngeal swelling or uvula swelling.     Tonsils: No tonsillar exudate or tonsillar abscesses.  Cardiovascular:     Rate and Rhythm: Normal rate and regular rhythm.     Heart sounds: Normal heart sounds. No murmur  heard.    No friction rub. No gallop.  Pulmonary:     Effort: Pulmonary effort is normal. No tachypnea or respiratory distress.     Breath sounds: Normal breath sounds. No decreased breath sounds, wheezing, rhonchi or rales.  Chest:     Chest wall: No tenderness.  Musculoskeletal:        General: Normal range of motion.     Cervical back: Normal range of motion.  Skin:    General: Skin is warm and dry.     Findings: Rash present. Rash is macular.      Neurological:     Mental Status: She is alert and oriented to person, place, and time.     Coordination: Coordination normal.  Psychiatric:        Behavior: Behavior normal. Behavior is cooperative.  Thought Content: Thought content normal.        Judgment: Judgment normal.          Patient has been counseled extensively about nutrition and exercise as well as the importance of adherence with medications and regular follow-up. The patient was given clear instructions to go to ER or return to medical center if symptoms don't improve, worsen or new problems develop. The patient verbalized understanding.   Follow-up: Return in about 6 months (around 10/10/2024).   Haze LELON Servant, FNP-BC [facility] [place], [place] [phone]   04/11/2024, 11:42 AM    [1] No Known Allergies

## 2024-04-12 LAB — LIPID PANEL
Chol/HDL Ratio: 4.5 ratio — ABNORMAL HIGH (ref 0.0–4.4)
Cholesterol, Total: 192 mg/dL (ref 100–199)
HDL: 43 mg/dL (ref >39)
LDL Chol Calc (NIH): 122 mg/dL — ABNORMAL HIGH (ref 0–99)
Triglycerides: 150 mg/dL — ABNORMAL HIGH (ref 0–149)
VLDL Cholesterol Cal: 27 mg/dL (ref 5–40)

## 2024-04-12 LAB — CBC WITH DIFFERENTIAL/PLATELET
Basophils Absolute: 0 x10E3/uL (ref 0.0–0.2)
Basos: 0 %
EOS (ABSOLUTE): 0.2 x10E3/uL (ref 0.0–0.4)
Eos: 3 %
Hematocrit: 41.9 % (ref 34.0–46.6)
Hemoglobin: 13.9 g/dL (ref 11.1–15.9)
Immature Grans (Abs): 0 x10E3/uL (ref 0.0–0.1)
Immature Granulocytes: 0 %
Lymphocytes Absolute: 1.6 x10E3/uL (ref 0.7–3.1)
Lymphs: 29 %
MCH: 30.3 pg (ref 26.6–33.0)
MCHC: 33.2 g/dL (ref 31.5–35.7)
MCV: 91 fL (ref 79–97)
Monocytes Absolute: 0.4 x10E3/uL (ref 0.1–0.9)
Monocytes: 7 %
Neutrophils Absolute: 3.4 x10E3/uL (ref 1.4–7.0)
Neutrophils: 61 %
Platelets: 220 x10E3/uL (ref 150–450)
RBC: 4.59 x10E6/uL (ref 3.77–5.28)
RDW: 13.1 % (ref 11.7–15.4)
WBC: 5.7 x10E3/uL (ref 3.4–10.8)

## 2024-04-12 LAB — CMP14+EGFR
ALT: 17 IU/L (ref 0–32)
AST: 23 IU/L (ref 0–40)
Albumin: 4.3 g/dL (ref 3.9–4.9)
Alkaline Phosphatase: 41 IU/L (ref 41–116)
BUN/Creatinine Ratio: 15 (ref 9–23)
BUN: 10 mg/dL (ref 6–24)
Bilirubin Total: 0.4 mg/dL (ref 0.0–1.2)
CO2: 21 mmol/L (ref 20–29)
Calcium: 9.3 mg/dL (ref 8.7–10.2)
Chloride: 101 mmol/L (ref 96–106)
Creatinine, Ser: 0.67 mg/dL (ref 0.57–1.00)
Globulin, Total: 2.6 g/dL (ref 1.5–4.5)
Glucose: 88 mg/dL (ref 70–99)
Potassium: 4.5 mmol/L (ref 3.5–5.2)
Sodium: 136 mmol/L (ref 134–144)
Total Protein: 6.9 g/dL (ref 6.0–8.5)
eGFR: 106 mL/min/1.73 (ref >59)

## 2024-04-12 LAB — THYROID PANEL WITH TSH
Free Thyroxine Index: 2.7 (ref 1.2–4.9)
T3 Uptake Ratio: 30 % (ref 24–39)
T4, Total: 9 ug/dL (ref 4.5–12.0)
TSH: 1.3 u[IU]/mL (ref 0.450–4.500)

## 2024-04-12 LAB — HEMOGLOBIN A1C
Est. average glucose Bld gHb Est-mCnc: 114 mg/dL
Hgb A1c MFr Bld: 5.6 % (ref 4.8–5.6)

## 2024-04-14 ENCOUNTER — Ambulatory Visit: Payer: Self-pay | Admitting: Nurse Practitioner

## 2024-04-14 ENCOUNTER — Other Ambulatory Visit: Payer: Self-pay

## 2024-04-14 DIAGNOSIS — E7800 Pure hypercholesterolemia, unspecified: Secondary | ICD-10-CM

## 2024-04-14 MED ORDER — ATORVASTATIN CALCIUM 20 MG PO TABS
20.0000 mg | ORAL_TABLET | Freq: Every day | ORAL | 3 refills | Status: AC
  Filled 2024-04-14: qty 90, 90d supply, fill #0

## 2024-04-18 ENCOUNTER — Other Ambulatory Visit: Payer: Self-pay

## 2024-04-18 ENCOUNTER — Telehealth: Payer: Self-pay

## 2024-04-18 NOTE — Telephone Encounter (Signed)
 Copied from CRM #[id number]. Topic: Clinical - Medication Question >> Apr 17, 2024  2:23 PM Tysheama G wrote: Reason for CRM: Patient received call from Pharmacy regarding 3 prescriptions was ready for pickup but patient is confused on what medications she would need to pick up. Callback number [phone]  Gwendloyn Kuster [phone]

## 2024-04-19 ENCOUNTER — Other Ambulatory Visit: Payer: Self-pay

## 2024-04-25 ENCOUNTER — Other Ambulatory Visit: Payer: Self-pay

## 2024-04-28 ENCOUNTER — Telehealth: Payer: Self-pay | Admitting: Nurse Practitioner

## 2024-04-28 ENCOUNTER — Other Ambulatory Visit: Payer: Self-pay

## 2024-04-28 ENCOUNTER — Other Ambulatory Visit: Payer: Self-pay | Admitting: Nurse Practitioner

## 2024-04-28 DIAGNOSIS — E039 Hypothyroidism, unspecified: Secondary | ICD-10-CM

## 2024-04-28 MED ORDER — LEVOTHYROXINE SODIUM 88 MCG PO TABS
88.0000 ug | ORAL_TABLET | Freq: Every day | ORAL | 0 refills | Status: AC
  Filled 2024-05-29: qty 87, 87d supply, fill #0

## 2024-04-28 NOTE — Telephone Encounter (Signed)
 Copied from CRM [id number]. Topic: Clinical - Medication Refill >> Apr 28, 2024 11:18 AM Tiffini S wrote: Medication:  levothyroxine  (SYNTHROID ) 88 MCG tablet   Has the patient contacted their pharmacy? No (Agent: If no, request that the patient contact the pharmacy for the refill. If patient does not wish to contact the pharmacy document the reason why and proceed with request.) (Agent: If yes, when and what did the pharmacy advise?)  This is the patient's preferred pharmacy:  [facility] [address] [place] [address] Phone: [phone] Fax: [phone]  Is this the correct pharmacy for this prescription? Yes If no, delete pharmacy and type the correct one.   Has the prescription been filled recently? Yes  Is the patient out of the medication? Yes  Has the patient been seen for an appointment in the last year OR does the patient have an upcoming appointment? Yes  Can we respond through MyChart? No, please call [phone]  Agent: Please be advised that Rx refills may take up to 3 business days. We ask that you follow-up with your pharmacy.   Spanish Juan ID# [phone]

## 2024-04-28 NOTE — Telephone Encounter (Unsigned)
 Copied from CRM [id number]. Topic: Clinical - Medication Question >> Apr 28, 2024 11:26 AM Tiffini S wrote: Reason for CRM: Patient was tested for labs- asked if she should continue to take  levothyroxine  (SYNTHROID ) 88 MCG tablet/ asked to take with nurse to discuss thyroid  results   Please call the patient at [phone]   Ervin Crate ID# [id number]

## 2024-04-28 NOTE — Telephone Encounter (Signed)
 Patient identified by name and date of birth.  Patient aware of results and voiced understanding.  Interpreter used to relay information to patient.  ID # is [id number].

## 2024-05-02 ENCOUNTER — Other Ambulatory Visit: Payer: Self-pay

## 2024-05-04 ENCOUNTER — Other Ambulatory Visit: Payer: Self-pay

## 2024-05-04 ENCOUNTER — Other Ambulatory Visit: Payer: Self-pay | Admitting: Pharmacist

## 2024-05-04 DIAGNOSIS — L853 Xerosis cutis: Secondary | ICD-10-CM

## 2024-05-04 MED ORDER — TRIAMCINOLONE ACETONIDE 0.5 % EX OINT
TOPICAL_OINTMENT | CUTANEOUS | 1 refills | Status: AC
  Filled 2024-05-04: qty 60, 30d supply, fill #0
  Filled 2024-06-02: qty 60, 30d supply, fill #1

## 2024-05-05 ENCOUNTER — Other Ambulatory Visit: Payer: Self-pay

## 2024-05-10 ENCOUNTER — Other Ambulatory Visit: Payer: Self-pay

## 2024-05-11 ENCOUNTER — Other Ambulatory Visit: Payer: Self-pay

## 2024-05-19 ENCOUNTER — Other Ambulatory Visit: Payer: Self-pay

## 2024-05-29 ENCOUNTER — Other Ambulatory Visit: Payer: Self-pay

## 2024-06-02 ENCOUNTER — Other Ambulatory Visit: Payer: Self-pay

## 2024-10-10 ENCOUNTER — Ambulatory Visit: Payer: Self-pay | Admitting: Nurse Practitioner
